# Patient Record
Sex: Female | Born: 1955 | Race: White | Hispanic: No | Marital: Married | Smoking: Former smoker
Health system: Southern US, Community
[De-identification: ages and names within clinical notes are randomized; demographics above are authoritative.]

## PROBLEM LIST (undated history)

## (undated) DIAGNOSIS — R011 Cardiac murmur, unspecified: Secondary | ICD-10-CM

## (undated) DIAGNOSIS — M1990 Unspecified osteoarthritis, unspecified site: Secondary | ICD-10-CM

## (undated) DIAGNOSIS — E785 Hyperlipidemia, unspecified: Secondary | ICD-10-CM

## (undated) DIAGNOSIS — K589 Irritable bowel syndrome without diarrhea: Secondary | ICD-10-CM

## (undated) DIAGNOSIS — I493 Ventricular premature depolarization: Secondary | ICD-10-CM

## (undated) DIAGNOSIS — K219 Gastro-esophageal reflux disease without esophagitis: Secondary | ICD-10-CM

## (undated) DIAGNOSIS — I10 Essential (primary) hypertension: Secondary | ICD-10-CM

## (undated) DIAGNOSIS — E039 Hypothyroidism, unspecified: Secondary | ICD-10-CM

## (undated) DIAGNOSIS — M8580 Other specified disorders of bone density and structure, unspecified site: Secondary | ICD-10-CM

## (undated) DIAGNOSIS — G44009 Cluster headache syndrome, unspecified, not intractable: Secondary | ICD-10-CM

## (undated) DIAGNOSIS — N83209 Unspecified ovarian cyst, unspecified side: Secondary | ICD-10-CM

## (undated) HISTORY — PX: CHOLECYSTECTOMY: SHX55

## (undated) HISTORY — DX: Unspecified osteoarthritis, unspecified site: M19.90

## (undated) HISTORY — PX: ANKLE SURGERY: SHX546

## (undated) HISTORY — DX: Cluster headache syndrome, unspecified, not intractable: G44.009

## (undated) HISTORY — DX: Irritable bowel syndrome without diarrhea: K58.9

## (undated) HISTORY — DX: Essential (primary) hypertension: I10

## (undated) HISTORY — DX: Other specified disorders of bone density and structure, unspecified site: M85.80

## (undated) HISTORY — DX: Ventricular premature depolarization: I49.3

## (undated) HISTORY — DX: Gastro-esophageal reflux disease without esophagitis: K21.9

## (undated) HISTORY — DX: Cardiac murmur, unspecified: R01.1

## (undated) HISTORY — PX: DILATION AND CURETTAGE OF UTERUS: SHX78

## (undated) HISTORY — PX: OTHER SURGICAL HISTORY: SHX169

## (undated) HISTORY — DX: Hypothyroidism, unspecified: E03.9

## (undated) HISTORY — DX: Hyperlipidemia, unspecified: E78.5

## (undated) HISTORY — DX: Unspecified ovarian cyst, unspecified side: N83.209

## (undated) HISTORY — PX: TOTAL SHOULDER REPLACEMENT: SUR1217

---

## 1998-04-24 ENCOUNTER — Other Ambulatory Visit: Admission: RE | Admit: 1998-04-24 | Discharge: 1998-04-24 | Payer: Self-pay | Admitting: *Deleted

## 1999-09-28 ENCOUNTER — Other Ambulatory Visit: Admission: RE | Admit: 1999-09-28 | Discharge: 1999-09-28 | Payer: Self-pay | Admitting: *Deleted

## 2000-04-30 ENCOUNTER — Ambulatory Visit (HOSPITAL_COMMUNITY): Admission: RE | Admit: 2000-04-30 | Discharge: 2000-04-30 | Payer: Self-pay | Admitting: Internal Medicine

## 2000-11-18 ENCOUNTER — Other Ambulatory Visit: Admission: RE | Admit: 2000-11-18 | Discharge: 2000-11-18 | Payer: Self-pay | Admitting: *Deleted

## 2001-12-24 ENCOUNTER — Other Ambulatory Visit: Admission: RE | Admit: 2001-12-24 | Discharge: 2001-12-24 | Payer: Self-pay | Admitting: *Deleted

## 2003-03-17 ENCOUNTER — Other Ambulatory Visit: Admission: RE | Admit: 2003-03-17 | Discharge: 2003-03-17 | Payer: Self-pay | Admitting: *Deleted

## 2004-03-22 ENCOUNTER — Other Ambulatory Visit: Admission: RE | Admit: 2004-03-22 | Discharge: 2004-03-22 | Payer: Self-pay | Admitting: Obstetrics and Gynecology

## 2004-06-12 ENCOUNTER — Encounter (INDEPENDENT_AMBULATORY_CARE_PROVIDER_SITE_OTHER): Payer: Self-pay | Admitting: Specialist

## 2004-06-12 ENCOUNTER — Ambulatory Visit (HOSPITAL_COMMUNITY): Admission: RE | Admit: 2004-06-12 | Discharge: 2004-06-12 | Payer: Self-pay | Admitting: Obstetrics and Gynecology

## 2005-05-20 ENCOUNTER — Encounter: Admission: RE | Admit: 2005-05-20 | Discharge: 2005-05-20 | Payer: Self-pay | Admitting: Obstetrics and Gynecology

## 2005-06-06 ENCOUNTER — Other Ambulatory Visit: Admission: RE | Admit: 2005-06-06 | Discharge: 2005-06-06 | Payer: Self-pay | Admitting: Obstetrics and Gynecology

## 2006-06-24 ENCOUNTER — Encounter: Admission: RE | Admit: 2006-06-24 | Discharge: 2006-06-24 | Payer: Self-pay | Admitting: Obstetrics and Gynecology

## 2006-06-27 ENCOUNTER — Encounter: Admission: RE | Admit: 2006-06-27 | Discharge: 2006-06-27 | Payer: Self-pay | Admitting: Obstetrics and Gynecology

## 2007-07-07 ENCOUNTER — Encounter: Admission: RE | Admit: 2007-07-07 | Discharge: 2007-07-07 | Payer: Self-pay | Admitting: Obstetrics and Gynecology

## 2008-07-13 ENCOUNTER — Encounter: Admission: RE | Admit: 2008-07-13 | Discharge: 2008-07-13 | Payer: Self-pay | Admitting: Obstetrics and Gynecology

## 2009-07-25 ENCOUNTER — Encounter: Admission: RE | Admit: 2009-07-25 | Discharge: 2009-07-25 | Payer: Self-pay | Admitting: Obstetrics and Gynecology

## 2010-03-11 ENCOUNTER — Encounter: Payer: Self-pay | Admitting: Obstetrics and Gynecology

## 2010-06-05 ENCOUNTER — Other Ambulatory Visit: Payer: Self-pay | Admitting: Obstetrics and Gynecology

## 2010-06-05 DIAGNOSIS — Z1231 Encounter for screening mammogram for malignant neoplasm of breast: Secondary | ICD-10-CM

## 2010-07-06 NOTE — Op Note (Signed)
NAME:  Maria Wade, Maria Wade           ACCOUNT NO.:  [id number]   MEDICAL RECORD NO.:  [id number]          PATIENT TYPE:  AMB   LOCATION:  SDC                           FACILITY:  [facility]   PHYSICIAN:  Michelle L. Grewal, M.D.DATE OF BIRTH:  07-26-1955   DATE OF PROCEDURE:  06/12/2004  DATE OF DISCHARGE:                                 OPERATIVE REPORT   PREOPERATIVE DIAGNOSES:  1.  Dysfunctional uterine bleeding.  2.  Endometrial polyp.   POSTOPERATIVE DIAGNOSES:  1.  Dysfunctional uterine bleeding.  2.  Endometrial polyp.   PROCEDURE:  1.  Dilatation and curettage.  2.  Diagnostic hysteroscopy.   ANESTHESIA:  MAC with local.   SPECIMENS:  Uterine curettings.   ESTIMATED BLOOD LOSS:  Minimal.   COMPLICATIONS:  None.   PROCEDURE:  The patient is taken to the operating room after informed  consent is obtained.  She is prepped and draped in usual sterile fashion and  an in-and-out catheter is used to empty the bladder.  A speculum is inserted  through the vagina.  The cervix is grasped with a tenaculum and the  paracervical block is performed in the standard fashion.  The cervical  internal os was gently dilated using Pratt dilators.  We then inserted the  diagnostic hysteroscope and the uterus was entirely inspected and  endometrium is noted to be clear except for small polypoid lesion notably  arising from the right side of the uterine wall.  The hysteroscope was  removed and a sharp curette was inserted.  The uterus was thoroughly  curetted of all tissue and all tissue is set aside.  Polyp forceps were also  used to remove the remainder of tissue.  We then reintroduced the diagnostic  hysteroscope into the uterine cavity and the uterine cavity was completely  cleaned at that point.  All instruments were removed from the vagina.  All  sponge, laparotomy pad, and instrument counts were correct x2.  The patient  went to the recovery room in stable condition.      MLG/MEDQ  D:   06/12/2004  T:  06/12/2004  Job:  [id number]

## 2010-07-30 ENCOUNTER — Ambulatory Visit
Admission: RE | Admit: 2010-07-30 | Discharge: 2010-07-30 | Disposition: A | Discharge status: Home / Self Care | Payer: 59 | Source: Ambulatory Visit | Attending: Obstetrics and Gynecology | Admitting: Obstetrics and Gynecology

## 2010-07-30 DIAGNOSIS — Z1231 Encounter for screening mammogram for malignant neoplasm of breast: Secondary | ICD-10-CM

## 2010-09-20 ENCOUNTER — Other Ambulatory Visit: Payer: Self-pay | Admitting: Cardiology

## 2010-09-20 ENCOUNTER — Ambulatory Visit
Admission: RE | Admit: 2010-09-20 | Discharge: 2010-09-20 | Disposition: A | Discharge status: Home / Self Care | Payer: 59 | Source: Ambulatory Visit | Attending: Cardiology | Admitting: Cardiology

## 2010-09-20 DIAGNOSIS — R002 Palpitations: Secondary | ICD-10-CM

## 2010-10-24 ENCOUNTER — Encounter: Payer: Self-pay | Admitting: Cardiology

## 2010-10-24 ENCOUNTER — Other Ambulatory Visit (HOSPITAL_COMMUNITY): Payer: Self-pay | Admitting: Cardiology

## 2010-11-02 ENCOUNTER — Other Ambulatory Visit (HOSPITAL_COMMUNITY): Payer: 59

## 2010-12-24 ENCOUNTER — Encounter: Payer: Self-pay | Admitting: Internal Medicine

## 2011-01-29 ENCOUNTER — Encounter: Payer: Self-pay | Admitting: *Deleted

## 2011-02-01 ENCOUNTER — Ambulatory Visit (INDEPENDENT_AMBULATORY_CARE_PROVIDER_SITE_OTHER): Payer: 59 | Admitting: Internal Medicine

## 2011-02-01 ENCOUNTER — Encounter: Payer: Self-pay | Admitting: Internal Medicine

## 2011-02-01 VITALS — BP 118/84 | HR 74 | Ht 64.0 in | Wt 202.3 lb

## 2011-02-01 DIAGNOSIS — K219 Gastro-esophageal reflux disease without esophagitis: Secondary | ICD-10-CM

## 2011-02-01 DIAGNOSIS — R1319 Other dysphagia: Secondary | ICD-10-CM

## 2011-02-01 NOTE — Progress Notes (Signed)
Maria Wade 10-15-1955 MRN [id number]   History of Present Illness:  This is a 55 year old white female with chronic gastroesophageal reflux first documented in 2004 when we saw her for irritable bowel syndrome with predominant diarrhea. A colonoscopy in 2004 was normal. She is due for a recall in May 2014. She is now complaining of nocturnal cough, hoarseness, and a bitter taste in her mouth within 30-40 minutes postprandially. She has had a remote cholecystectomy. She has occasional sensation of globus in her throat. She does not smoke or drink alcohol. Her weight has been stable at 200 pounds. She denies dysphagia or odynophagia. Her current medications are Prilosec 40 mg twice a day. Her brother has Barrett's esophagus.   Past Medical History  Diagnosis Date  . Hyperlipidemia   . Irritable bowel syndrome   . Hypothyroidism   . Ovarian cyst   . Osteopenia   . Cluster headaches    Past Surgical History  Procedure Date  . Cholecystectomy   . Dilation and curettage of uterus   . Ankle surgery     left  . Stromal puncture     both eyes    reports that she quit smoking about 20 years ago. Her smoking use included Cigarettes. She has never used smokeless tobacco. She reports that she does not drink alcohol or use illicit drugs. family history includes Barrett's esophagus in her brother; COPD in her mother; Colon polyps in her father; Crohn's disease in her sister; Heart disease in her mother; Prostate cancer in her father; and Stroke in an unspecified family member. No Known Allergies      Review of Systems: Negative for abdominal pain diarrhea shortness of breath or chest pain  The remainder of the 10 point ROS is negative except as outlined in H&P   Physical Exam: General appearance  Well developed, in no distress. Eyes- non icteric. HEENT nontraumatic, normocephalic. Mouth no lesions, tongue papillated, no cheilosis. Neck supple without adenopathy, thyroid not  enlarged, no carotid bruits, no JVD. Lungs Clear to auscultation bilaterally. Cor normal S1, normal S2, regular rhythm, no murmur,  quiet precordium. Abdomen: Soft with minimal tenderness in epigastrium. Normoactive bowel sounds. Rectal: Not done Extremities no pedal edema. Skin no lesions. Neurological alert and oriented x 3. Psychological normal mood and affect.  Assessment and Plan:  Problem #1 Chronic gastroesophageal reflux with a recent exacerbation raising the question of LPR. This occurs postprandially as well as at night. Her bitter taste in the mouth may represent bile reflux. She has a family history of Barrett's esophagus and she is concerned about it. We will proceed with an upper endoscopy and biopsies. We will rule out Barrett's esophagus or hiatal hernia. She was in the past on Nexium 40 mg twice a day and we may consider switching to that eventually. She will also supplement her regimen with Gaviscon. She may also use Carafate slurry if we find bile reflux. I have asked her to elevate the head of the bed at night.  Problem #2 Colorectal screening. Patient is due for a colonoscopy in May 2014.   02/01/2011 Maria Wade

## 2011-02-01 NOTE — Patient Instructions (Signed)
You have been scheduled for an endoscopy with propofol. Please follow written instructions given to you at your visit today. CC: Dr Chilton Greathouse

## 2011-02-06 ENCOUNTER — Encounter: Payer: 59 | Admitting: Internal Medicine

## 2011-02-06 ENCOUNTER — Ambulatory Visit (AMBULATORY_SURGERY_CENTER): Payer: 59 | Admitting: Internal Medicine

## 2011-02-06 ENCOUNTER — Encounter: Payer: Self-pay | Admitting: Internal Medicine

## 2011-02-06 DIAGNOSIS — R1319 Other dysphagia: Secondary | ICD-10-CM

## 2011-02-06 DIAGNOSIS — K2289 Other specified disease of esophagus: Secondary | ICD-10-CM

## 2011-02-06 DIAGNOSIS — K209 Esophagitis, unspecified without bleeding: Secondary | ICD-10-CM

## 2011-02-06 DIAGNOSIS — K219 Gastro-esophageal reflux disease without esophagitis: Secondary | ICD-10-CM

## 2011-02-06 DIAGNOSIS — K228 Other specified diseases of esophagus: Secondary | ICD-10-CM

## 2011-02-06 MED ORDER — ESOMEPRAZOLE MAGNESIUM 40 MG PO CPDR: 40.0000 mg | DELAYED_RELEASE_CAPSULE | Freq: Every day | ORAL | Status: DC

## 2011-02-06 MED ORDER — SODIUM CHLORIDE 0.9 % IV SOLN: 500.0000 mL | INTRAVENOUS | Status: DC

## 2011-02-06 NOTE — Op Note (Signed)
[facility] [address] [place], [place]  [address]  ENDOSCOPY PROCEDURE REPORT  PATIENT:  Maria, Wade  MR#:  [id number] BIRTHDATE:  1955/06/23, 55 yrs. old  GENDER:  female  ENDOSCOPIST:  Hedwig Morton. Juanda Chance, MD Referred by:  Chilton Greathouse, M.D.  PROCEDURE DATE:  02/06/2011 PROCEDURE:  EGD with biopsy, 43239 ASA CLASS:  Class I INDICATIONS:  GERD, heartburn brother with Barrett's esophagus refractory to Prilosec  MEDICATIONS:   MAC sedation, administered by CRNA, propofol (Diprivan) TOPICAL ANESTHETIC:  none  DESCRIPTION OF PROCEDURE:   After the risks benefits and alternatives of the procedure were thoroughly explained, informed consent was obtained.  The LB [id number]-H180 [id number] endoscope was introduced through the mouth and advanced to the second portion of the duodenum, without limitations.  The instrument was slowly withdrawn as the mucosa was fully examined. <<PROCEDUREIMAGES>>  The upper, middle, and distal third of the esophagus were carefully inspected and no abnormalities were noted. The z-line was well seen at the GEJ. The endoscope was pushed into the fundus which was normal including a retroflexed view. The antrum,gastric body, first and second part of the duodenum were unremarkable. few fundic gland polyps With standard forceps, a biopsy was obtained and sent to pathology (see image1, image3, image4, image5, image6, image7, and image8). Bx at g-e junction  Bile reflux was found (see image2).    Retroflexed views revealed no abnormalities. The scope was then withdrawn from the patient and the procedure completed.  COMPLICATIONS:  None  ENDOSCOPIC IMPRESSION: 1) Normal EGD 2) Bile reflux RECOMMENDATIONS: 1) Await biopsy results Nexiem 40 mg po qd, if not saficcient,try to add Prilosec 40 mg   REPEAT EXAM:  In 0 year(s) for.  ______________________________ Hedwig Morton. Juanda Chance, MD  CC:  n. eSIGNED:   Hedwig Morton. Damonie Furney at 02/06/2011 09:51  AM  Arlana Lindau, [phone]

## 2011-02-06 NOTE — Patient Instructions (Signed)
Please refer to your blue and neon green sheets for instructions regarding diet and activity for the rest of today.  You may resume your medications as you would normally take them.   Nexium prescription given.

## 2011-02-06 NOTE — Progress Notes (Signed)
Patient did not experience any of the following events: a burn prior to discharge; a fall within the facility; wrong site/side/patient/procedure/implant event; or a hospital transfer or hospital admission upon discharge from the facility. (G8907) Patient did not have preoperative order for IV antibiotic SSI prophylaxis. (G8918)  

## 2011-02-07 ENCOUNTER — Telehealth: Payer: Self-pay | Admitting: *Deleted

## 2011-02-07 NOTE — Telephone Encounter (Signed)
No answer, left message ID on machine

## 2011-02-12 ENCOUNTER — Encounter: Payer: Self-pay | Admitting: Internal Medicine

## 2011-03-06 ENCOUNTER — Encounter: Payer: 59 | Admitting: Internal Medicine

## 2011-06-25 ENCOUNTER — Other Ambulatory Visit: Payer: Self-pay | Admitting: Obstetrics and Gynecology

## 2011-06-25 DIAGNOSIS — Z1231 Encounter for screening mammogram for malignant neoplasm of breast: Secondary | ICD-10-CM

## 2011-07-25 ENCOUNTER — Other Ambulatory Visit: Payer: Self-pay | Admitting: Obstetrics and Gynecology

## 2011-08-05 ENCOUNTER — Ambulatory Visit
Admission: RE | Admit: 2011-08-05 | Discharge: 2011-08-05 | Disposition: A | Discharge status: Home / Self Care | Payer: 59 | Source: Ambulatory Visit | Attending: Obstetrics and Gynecology | Admitting: Obstetrics and Gynecology

## 2011-08-05 DIAGNOSIS — Z1231 Encounter for screening mammogram for malignant neoplasm of breast: Secondary | ICD-10-CM

## 2011-08-08 ENCOUNTER — Other Ambulatory Visit: Payer: Self-pay | Admitting: Obstetrics and Gynecology

## 2011-08-08 DIAGNOSIS — R928 Other abnormal and inconclusive findings on diagnostic imaging of breast: Secondary | ICD-10-CM

## 2011-08-12 ENCOUNTER — Other Ambulatory Visit: Payer: Self-pay | Admitting: Obstetrics and Gynecology

## 2011-08-12 ENCOUNTER — Ambulatory Visit
Admission: RE | Admit: 2011-08-12 | Discharge: 2011-08-12 | Disposition: A | Discharge status: Home / Self Care | Payer: 59 | Source: Ambulatory Visit | Attending: Obstetrics and Gynecology | Admitting: Obstetrics and Gynecology

## 2011-08-12 DIAGNOSIS — R928 Other abnormal and inconclusive findings on diagnostic imaging of breast: Secondary | ICD-10-CM

## 2011-08-13 ENCOUNTER — Encounter: Payer: Self-pay | Admitting: *Deleted

## 2011-08-16 ENCOUNTER — Other Ambulatory Visit: Payer: Self-pay | Admitting: Internal Medicine

## 2011-08-23 ENCOUNTER — Ambulatory Visit (INDEPENDENT_AMBULATORY_CARE_PROVIDER_SITE_OTHER): Payer: 59 | Admitting: Internal Medicine

## 2011-08-23 ENCOUNTER — Encounter: Payer: Self-pay | Admitting: Internal Medicine

## 2011-08-23 VITALS — BP 130/80 | HR 80 | Ht 64.0 in | Wt 210.0 lb

## 2011-08-23 DIAGNOSIS — R195 Other fecal abnormalities: Secondary | ICD-10-CM

## 2011-08-23 MED ORDER — MOVIPREP 100 G PO SOLR: ORAL | Status: DC

## 2011-08-23 NOTE — Patient Instructions (Addendum)
You have been scheduled for a colonoscopy. Please follow written instructions given to you at your visit today.  Please pick up your prep kit at the pharmacy within the next 1-3 days. CC: Dr Felipa Eth

## 2011-08-23 NOTE — Progress Notes (Signed)
Maria Wade 07/10/1955 MRN [id number]   History of Present Illness:  This is a 55-year-old, white female with Hemoccult-positive stool on a home test (Hemosure). She repeated similar home tests 3 times since then and they have been negative. She denies any visible blood per rectum. We have seen her in the past for chronic gastroesophageal reflux and for a colorectal screening. Last upper endoscopy in December 2012 showed the bile reflux. Her last colonoscopy in May 2004 was normal. She has a positive family history of colon polyps in her father and of Barrett's esophagus in her brother and Crohn's disease in her brother and sister. She denies any diarrhea. There is a history of a laparoscopic cholecystectomy. Her last hemoglobin in May 2013 was 13.5, and her hematocrit was 39.3. She is postmenopausal.   Past Medical History  Diagnosis Date  . Hyperlipidemia   . Irritable bowel syndrome   . Hypothyroidism   . Ovarian cyst   . Osteopenia   . Cluster headaches   . PVC (premature ventricular contraction)     occasional PVCs  . GERD (gastroesophageal reflux disease)    Past Surgical History  Procedure Date  . Cholecystectomy   . Dilation and curettage of uterus   . Ankle surgery     left  . Stromal puncture     both eyes    reports that she quit smoking about 21 years ago. Her smoking use included Cigarettes. She has never used smokeless tobacco. She reports that she does not drink alcohol or use illicit drugs. family history includes Barrett's esophagus in her brother; COPD in her mother; Colon polyps in her father; Crohn's disease in her sister; Heart disease in her mother; Prostate cancer in her father; and Stroke in an unspecified family member.  There is no history of Colon cancer, and Esophageal cancer, and Rectal cancer, and Stomach cancer, . No Known Allergies      Review of Systems: Gastroesophageal reflux controlled on Nexiem negative for dysphagia. Negative for  visible blood per rectum  The remainder of the 10 point ROS is negative except as outlined in H&P   Physical Exam: General appearance  Well developed, in no distress. Eyes- non icteric. HEENT nontraumatic, normocephalic. Mouth no lesions, tongue papillated, no cheilosis. Neck supple without adenopathy, thyroid not enlarged, no carotid bruits, no JVD. Lungs Clear to auscultation bilaterally. Cor normal S1, normal S2, regular rhythm, no murmur,  quiet precordium. Abdomen: Soft nontender abdomen with normal active bowel sounds. Post cholecystectomy scar. Liver edge at costal margin. Rectal: Not done. Extremities no pedal edema. Skin no lesions. Neurological alert and oriented x 3. Psychological normal mood and affect.  Assessment and Plan:  Problem #1 55-year-old white female with heme positive stool on a home test. Subsequent tests have been negative. She is due for a recall colonoscopy. Her last one was in May 2004. We will go ahead and schedule an outpatient colonoscopy with moviprep. Her gastroesophageal reflux is controlled on Nexium. I have discussed the procedure, prep and the sedation with the patient.  08/23/2011 Maria Wade  

## 2011-08-24 ENCOUNTER — Encounter: Payer: Self-pay | Admitting: Internal Medicine

## 2011-09-09 ENCOUNTER — Encounter (HOSPITAL_COMMUNITY): Payer: Self-pay | Admitting: *Deleted

## 2011-09-09 ENCOUNTER — Encounter (HOSPITAL_COMMUNITY)
Admission: RE | Disposition: A | Discharge status: Home / Self Care | Payer: Self-pay | Source: Ambulatory Visit | Attending: Internal Medicine

## 2011-09-09 ENCOUNTER — Ambulatory Visit (HOSPITAL_COMMUNITY)
Admission: RE | Admit: 2011-09-09 | Discharge: 2011-09-09 | Disposition: A | Discharge status: Home / Self Care | Payer: 59 | Source: Ambulatory Visit | Attending: Internal Medicine | Admitting: Internal Medicine

## 2011-09-09 DIAGNOSIS — K219 Gastro-esophageal reflux disease without esophagitis: Secondary | ICD-10-CM | POA: Insufficient documentation

## 2011-09-09 DIAGNOSIS — Z83719 Family history of colon polyps, unspecified: Secondary | ICD-10-CM | POA: Insufficient documentation

## 2011-09-09 DIAGNOSIS — Z8379 Family history of other diseases of the digestive system: Secondary | ICD-10-CM | POA: Insufficient documentation

## 2011-09-09 DIAGNOSIS — E039 Hypothyroidism, unspecified: Secondary | ICD-10-CM | POA: Insufficient documentation

## 2011-09-09 DIAGNOSIS — Z8371 Family history of colonic polyps: Secondary | ICD-10-CM | POA: Insufficient documentation

## 2011-09-09 DIAGNOSIS — R195 Other fecal abnormalities: Secondary | ICD-10-CM

## 2011-09-09 DIAGNOSIS — E785 Hyperlipidemia, unspecified: Secondary | ICD-10-CM | POA: Insufficient documentation

## 2011-09-09 DIAGNOSIS — D126 Benign neoplasm of colon, unspecified: Secondary | ICD-10-CM

## 2011-09-09 HISTORY — PX: COLONOSCOPY: SHX5424

## 2011-09-09 SURGERY — COLONOSCOPY
Anesthesia: Moderate Sedation

## 2011-09-09 MED ORDER — MIDAZOLAM HCL 5 MG/5ML IJ SOLN
INTRAMUSCULAR | Status: DC | PRN
  Administered 2011-09-09: 1 mg via INTRAVENOUS
  Administered 2011-09-09 (×2): 2 mg via INTRAVENOUS
  Administered 2011-09-09 (×2): 2.5 mg via INTRAVENOUS

## 2011-09-09 MED ORDER — SODIUM CHLORIDE 0.9 % IV SOLN
INTRAVENOUS | Status: DC
  Administered 2011-09-09: 500 mL via INTRAVENOUS

## 2011-09-09 MED ORDER — FENTANYL CITRATE 0.05 MG/ML IJ SOLN
INTRAMUSCULAR | Status: AC
  Filled 2011-09-09: qty 4

## 2011-09-09 MED ORDER — DIPHENHYDRAMINE HCL 50 MG/ML IJ SOLN
INTRAMUSCULAR | Status: AC
  Filled 2011-09-09: qty 1

## 2011-09-09 MED ORDER — MIDAZOLAM HCL 10 MG/2ML IJ SOLN
INTRAMUSCULAR | Status: AC
  Filled 2011-09-09: qty 4

## 2011-09-09 MED ORDER — FENTANYL CITRATE 0.05 MG/ML IJ SOLN
INTRAMUSCULAR | Status: DC | PRN
  Administered 2011-09-09 (×4): 25 ug via INTRAVENOUS

## 2011-09-09 MED ORDER — DIPHENHYDRAMINE HCL 50 MG/ML IJ SOLN
INTRAMUSCULAR | Status: DC | PRN
  Administered 2011-09-09: 25 mg via INTRAVENOUS

## 2011-09-09 NOTE — Interval H&P Note (Signed)
History and Physical Interval Note:  09/09/2011 9:09 AM  Maria Wade  has presented today for surgery, with the diagnosis of heme positive stool  The various methods of treatment have been discussed with the patient and family. After consideration of risks, benefits and other options for treatment, the patient has consented to  Procedure(s) (LRB): COLONOSCOPY (N/A) as a surgical intervention .  The patient's history has been reviewed, patient examined, no change in status, stable for surgery.  I have reviewed the patient's chart and labs.  Questions were answered to the patient's satisfaction.     [name]

## 2011-09-09 NOTE — H&P (View-Only) (Signed)
Maria Wade Aug 12, 1955 MRN [id number]   History of Present Illness:  This is a 56 year old, white female with Hemoccult-positive stool on a home test (Hemosure). She repeated similar home tests 3 times since then and they have been negative. She denies any visible blood per rectum. We have seen her in the past for chronic gastroesophageal reflux and for a colorectal screening. Last upper endoscopy in December 2012 showed the bile reflux. Her last colonoscopy in May 2004 was normal. She has a positive family history of colon polyps in her father and of Barrett's esophagus in her brother and Crohn's disease in her brother and sister. She denies any diarrhea. There is a history of a laparoscopic cholecystectomy. Her last hemoglobin in May 2013 was 13.5, and her hematocrit was 39.3. She is postmenopausal.   Past Medical History  Diagnosis Date  . Hyperlipidemia   . Irritable bowel syndrome   . Hypothyroidism   . Ovarian cyst   . Osteopenia   . Cluster headaches   . PVC (premature ventricular contraction)     occasional PVCs  . GERD (gastroesophageal reflux disease)    Past Surgical History  Procedure Date  . Cholecystectomy   . Dilation and curettage of uterus   . Ankle surgery     left  . Stromal puncture     both eyes    reports that she quit smoking about 21 years ago. Her smoking use included Cigarettes. She has never used smokeless tobacco. She reports that she does not drink alcohol or use illicit drugs. family history includes Barrett's esophagus in her brother; COPD in her mother; Colon polyps in her father; Crohn's disease in her sister; Heart disease in her mother; Prostate cancer in her father; and Stroke in an unspecified family member.  There is no history of Colon cancer, and Esophageal cancer, and Rectal cancer, and Stomach cancer, . No Known Allergies      Review of Systems: Gastroesophageal reflux controlled on Nexiem negative for dysphagia. Negative for  visible blood per rectum  The remainder of the 10 point ROS is negative except as outlined in H&P   Physical Exam: General appearance  Well developed, in no distress. Eyes- non icteric. HEENT nontraumatic, normocephalic. Mouth no lesions, tongue papillated, no cheilosis. Neck supple without adenopathy, thyroid not enlarged, no carotid bruits, no JVD. Lungs Clear to auscultation bilaterally. Cor normal S1, normal S2, regular rhythm, no murmur,  quiet precordium. Abdomen: Soft nontender abdomen with normal active bowel sounds. Post cholecystectomy scar. Liver edge at costal margin. Rectal: Not done. Extremities no pedal edema. Skin no lesions. Neurological alert and oriented x 3. Psychological normal mood and affect.  Assessment and Plan:  Problem #39 56 year old white female with heme positive stool on a home test. Subsequent tests have been negative. She is due for a recall colonoscopy. Her last one was in May 2004. We will go ahead and schedule an outpatient colonoscopy with moviprep. Her gastroesophageal reflux is controlled on Nexium. I have discussed the procedure, prep and the sedation with the patient.  08/23/2011 [name]

## 2011-09-09 NOTE — Op Note (Signed)
[facility] [address] [place], [place]  [address]  COLONOSCOPY PROCEDURE REPORT  PATIENT:  Maria Wade, Maria Wade  MR#:  [id number] BIRTHDATE:  1956/01/10, 56 yrs. old  GENDER:  female ENDOSCOPIST:  Hedwig Morton. Juanda Chance, MD REF. BY:  Chilton Greathouse, M.D. PROCEDURE DATE:  09/09/2011 PROCEDURE:  Colonoscopy with snare polypectomy ASA CLASS:  Class II INDICATIONS:  heme positive stool MEDICATIONS:   These medications were titrated to patient response per physician's verbal order, Versed 10 mg, Fentanyl 100 mcg, Benadryl 25 mg  DESCRIPTION OF PROCEDURE:   After the risks and benefits and of the procedure were explained, informed consent was obtained. Digital rectal exam was performed and revealed no rectal masses. The Pentax Peds Colonoscope [id number] endoscope was introduced through the anus and advanced to the cecum, which was identified by both the appendix and ileocecal valve.  The quality of the prep was excellent, using MoviPrep.  The instrument was then slowly withdrawn as the colon was fully examined. <<PROCEDUREIMAGES>>  FINDINGS:  A pedunculated polyp was found. 10 mm polyp at 50 cm, bleeding spots on the surface Polyp was snared without cautery. Retrieval was successful (see image4, image5, image6, and image7). snare polyp  This was otherwise a normal examination of the colon (see image8, image1, image2, and image3).   Retroflexed views in the rectum revealed no abnormalities.    The scope was then withdrawn from the patient and the procedure completed.  COMPLICATIONS:  None ENDOSCOPIC IMPRESSION: 1) Pedunculated polyp 2) Otherwise normal examination heme positive stool likely from bleeding polyp RECOMMENDATIONS: 1) Await pathology results 2) High fiber diet.  REPEAT EXAM:  In 5 year(s) for.  ______________________________ Hedwig Morton. Juanda Chance, MD  CC:  n. eSIGNED:   Hedwig Morton. Brodie at 09/09/2011 10:30 AM  Arlana Lindau, [id number]

## 2011-09-10 ENCOUNTER — Encounter: Payer: Self-pay | Admitting: Internal Medicine

## 2011-09-11 ENCOUNTER — Encounter (HOSPITAL_COMMUNITY): Payer: Self-pay | Admitting: Internal Medicine

## 2011-12-16 ENCOUNTER — Other Ambulatory Visit: Payer: Self-pay | Admitting: Gastroenterology

## 2012-02-20 ENCOUNTER — Other Ambulatory Visit: Payer: Self-pay | Admitting: *Deleted

## 2012-02-20 MED ORDER — ESOMEPRAZOLE MAGNESIUM 40 MG PO CPDR: 40.0000 mg | DELAYED_RELEASE_CAPSULE | Freq: Every day | ORAL | Status: DC

## 2012-06-30 ENCOUNTER — Other Ambulatory Visit: Payer: Self-pay

## 2012-06-30 DIAGNOSIS — Z1231 Encounter for screening mammogram for malignant neoplasm of breast: Secondary | ICD-10-CM

## 2012-07-28 ENCOUNTER — Other Ambulatory Visit: Payer: Self-pay | Admitting: Obstetrics and Gynecology

## 2012-08-17 ENCOUNTER — Ambulatory Visit
Admission: RE | Admit: 2012-08-17 | Discharge: 2012-08-17 | Disposition: A | Discharge status: Home / Self Care | Payer: 59 | Source: Ambulatory Visit

## 2012-08-17 DIAGNOSIS — Z1231 Encounter for screening mammogram for malignant neoplasm of breast: Secondary | ICD-10-CM

## 2012-10-21 ENCOUNTER — Other Ambulatory Visit: Payer: Self-pay | Admitting: Neurology

## 2012-10-21 DIAGNOSIS — R9082 White matter disease, unspecified: Secondary | ICD-10-CM

## 2012-11-09 ENCOUNTER — Ambulatory Visit
Admission: RE | Admit: 2012-11-09 | Discharge: 2012-11-09 | Disposition: A | Discharge status: Home / Self Care | Payer: 59 | Source: Ambulatory Visit | Attending: Neurology | Admitting: Neurology

## 2012-11-09 DIAGNOSIS — R9082 White matter disease, unspecified: Secondary | ICD-10-CM

## 2012-11-09 MED ORDER — GADOBENATE DIMEGLUMINE 529 MG/ML IV SOLN
19.0000 mL | Freq: Once | INTRAVENOUS | Status: AC | PRN
  Administered 2012-11-09: 19 mL via INTRAVENOUS

## 2013-07-14 ENCOUNTER — Other Ambulatory Visit: Payer: Self-pay

## 2013-07-14 DIAGNOSIS — Z1231 Encounter for screening mammogram for malignant neoplasm of breast: Secondary | ICD-10-CM

## 2013-08-05 ENCOUNTER — Other Ambulatory Visit: Payer: Self-pay | Admitting: Obstetrics and Gynecology

## 2013-08-06 LAB — CYTOLOGY - PAP

## 2013-09-13 ENCOUNTER — Ambulatory Visit
Admission: RE | Admit: 2013-09-13 | Discharge: 2013-09-13 | Disposition: A | Discharge status: Home / Self Care | Payer: 59 | Source: Ambulatory Visit

## 2013-09-13 DIAGNOSIS — Z1231 Encounter for screening mammogram for malignant neoplasm of breast: Secondary | ICD-10-CM

## 2014-02-24 ENCOUNTER — Other Ambulatory Visit: Payer: Self-pay | Admitting: Neurology

## 2014-02-24 DIAGNOSIS — R9082 White matter disease, unspecified: Secondary | ICD-10-CM

## 2014-03-14 ENCOUNTER — Ambulatory Visit
Admission: RE | Admit: 2014-03-14 | Discharge: 2014-03-14 | Disposition: A | Discharge status: Home / Self Care | Payer: 59 | Source: Ambulatory Visit | Attending: Neurology | Admitting: Neurology

## 2014-03-14 DIAGNOSIS — R9082 White matter disease, unspecified: Secondary | ICD-10-CM

## 2014-07-27 ENCOUNTER — Other Ambulatory Visit: Payer: Self-pay

## 2014-07-27 DIAGNOSIS — Z1231 Encounter for screening mammogram for malignant neoplasm of breast: Secondary | ICD-10-CM

## 2014-09-19 ENCOUNTER — Ambulatory Visit
Admission: RE | Admit: 2014-09-19 | Discharge: 2014-09-19 | Disposition: A | Discharge status: Home / Self Care | Payer: 59 | Source: Ambulatory Visit

## 2014-09-19 DIAGNOSIS — Z1231 Encounter for screening mammogram for malignant neoplasm of breast: Secondary | ICD-10-CM

## 2015-10-11 ENCOUNTER — Encounter: Payer: Self-pay | Admitting: Pulmonary Disease

## 2015-11-28 ENCOUNTER — Ambulatory Visit (INDEPENDENT_AMBULATORY_CARE_PROVIDER_SITE_OTHER): Payer: 59 | Admitting: Gastroenterology

## 2015-11-28 ENCOUNTER — Encounter: Payer: Self-pay | Admitting: Gastroenterology

## 2015-11-28 DIAGNOSIS — R197 Diarrhea, unspecified: Secondary | ICD-10-CM

## 2015-11-28 DIAGNOSIS — A09 Infectious gastroenteritis and colitis, unspecified: Secondary | ICD-10-CM | POA: Diagnosis not present

## 2015-11-28 NOTE — Progress Notes (Signed)
[facility] Consult Note:  History: Maria Wade 11/28/2015  Referring physician: Tivis Ringer, MD  Reason for consult/chief complaint: Diarrhea (Patient has had it for 3 months, but now is better)   Subjective  HPI:  This is a 60 year old woman referred by primary care for chronic diarrhea. She believes it started several months ago when she began taking an herbal supplement for colon health that many people at her church were also taken. She believes it contained milk of magnesia and probiotics. She then developed chronic watery diarrhea up to 6 bowel movements per day with some nocturnal diarrhea as well. She slowly weaned off the medicine and the last dose about a month ago. Since then things are slowly improved proving and returning to normal. She still has postprandial bowel movements on the stool is still sometimes semi-formed or loose. Abdomen rectal bleeding, there is no abdominal pain, her appetite is good and her weight stable. She also recalls taking azithromycin for bronchitis sometime in April. Alexander's last colonoscopy with Dr. Olevia Perches in July 2015 revealed a tubular adenoma. She saw primary care for a physical in late August but apparently did not mention this, and thus no workup has been done. ROS:  Review of Systems  Constitutional: Negative for appetite change and unexpected weight change.  HENT: Negative for mouth sores and voice change.   Eyes: Negative for pain and redness.  Respiratory: Negative for cough and shortness of breath.   Cardiovascular: Negative for chest pain and palpitations.  Genitourinary: Negative for dysuria and hematuria.  Musculoskeletal: Negative for arthralgias and myalgias.  Skin: Negative for pallor and rash.  Neurological: Negative for weakness and headaches.  Hematological: Negative for adenopathy.     Past Medical History: Past Medical History:  Diagnosis Date  . Cluster headaches   . GERD  (gastroesophageal reflux disease)   . Hyperlipidemia   . Hypothyroidism   . Irritable bowel syndrome   . Osteopenia   . Ovarian cyst   . PVC (premature ventricular contraction)    occasional PVCs     Past Surgical History: Past Surgical History:  Procedure Laterality Date  . ANKLE SURGERY     left  . CHOLECYSTECTOMY    . COLONOSCOPY  09/09/2011   Procedure: COLONOSCOPY;  Surgeon: Lafayette Dragon, MD;  Location: [facility] END[facility]COPY;  Service: Endoscopy;  Laterality: N/A;  . DILATION AND CURETTAGE OF UTERUS    . stromal puncture     both eyes     Family History: Family History  Problem Relation Age of Onset  . Colon polyps Father   . Prostate cancer Father   . Crohn's disease Sister     Brother  . Barrett's esophagus Brother   . Stroke      grandparents  . COPD Mother   . Heart disease Mother   . Colon cancer Neg Hx   . Esophageal cancer Neg Hx   . Rectal cancer Neg Hx   . Stomach cancer Neg Hx     Social History: Social History   Social History  . Marital status: Married    Spouse name: N/A  . Number of children: 2  . Years of education: N/A   Occupational History  . retired Education officer, [employer]   Social History Main Topics  . Smoking status: Former Smoker    Types: Cigarettes    Quit date: 02/18/1990  . Smokeless tobacco: Never Used  . Alcohol use No  . Drug use: No  . Sexual  activity: Not Asked   Other Topics Concern  . None   Social History Narrative  . None    Allergies: No Known Allergies  Outpatient Meds: Current Outpatient Prescriptions  Medication Sig Dispense Refill  . calcium carbonate (OS-CAL) 600 MG TABS Take 600 mg by mouth 2 (two) times daily with a meal.    . calcium gluconate 650 MG tablet Take 650 mg by mouth daily.      . cholecalciferol (VITAMIN D) 1000 UNITS tablet Take 1,000 Units by mouth daily.      . cyanocobalamin 500 MCG tablet Take 500 mcg by mouth daily.      Marland Kitchen esomeprazole (NEXIUM) 40 MG capsule Take 1 capsule  (40 mg total) by mouth daily before breakfast. 90 capsule 1  . rosuvastatin (CRESTOR) 5 MG tablet Take 5 mg by mouth daily.     No current facility-administered medications for this visit.       ___________________________________________________________________ Objective   Exam:  BP 122/80   Pulse 78   Ht 5\' 4"  (1.626 m)   Wt 226 lb 12.8 oz (102.9 kg)   SpO2 98%   BMI 38.93 kg/m    General: this is a(n) well-appearing woman with good muscle mass   Eyes: sclera anicteric, no redness  ENT: oral mucosa moist without lesions, no cervical or supraclavicular lymphadenopathy, good dentition  CV: RRR without murmur, S1/S2, no JVD, no peripheral edema  Resp: clear to auscultation bilaterally, normal RR and effort noted  GI: soft, overweight, no tenderness, with active bowel sounds. No guarding or palpable organomegaly noted.  Skin; warm and dry, no rash or jaundice noted  Neuro: awake, alert and oriented x 3. Normal gross motor function and fluent speech  Labs:  Recurrent notes indicate normal CBC, CMP and TSH in August of this year.  Assessment: Encounter Diagnosis  Name Primary?  . Diarrhea of presumed infectious origin     The exact components of this herbal supplement" are currently unknown, but it seems unlikely should still be causing diarrhea. However, it sounds like things are also slowly but steadily improving on their own. I have ordered stool testing to rule out C. Difficile and ova and parasites. If negative, and she is not back to normal when she calls [place] in 3 or 4 weeks, we will schedule a colonoscopy to rule out microscopic colitis or less likely Crohn's disease ( despite family history).    Thank you for the courtesy of this consult.  Please call me with any questions or concerns.  Nelida Meuse III  CC: Tivis Ringer, MD

## 2015-11-28 NOTE — Patient Instructions (Addendum)
Stool studies as ordered.  Please call us in 3-4 weeks with an update.If not improved, we will schedule a colonoscopy.  If you are age 61 or older, your body mass index should be between 23-30. Your Body mass index is 38.93 kg/m. If this is out of the aforementioned range listed, please consider follow up with your Primary Care Provider.  If you are age 71 or younger, your body mass index should be between 19-25. Your Body mass index is 38.93 kg/m. If this is out of the aformentioned range listed, please consider follow up with your Primary Care Provider.   Thank you for choosing [facility]  Dr Wilfrid Lund III

## 2015-11-28 NOTE — Progress Notes (Deleted)
[facility] Consult Note:  History: Maria Wade 11/28/2015  Referring physician: Tivis Ringer, MD  Reason for consult/chief complaint: Diarrhea (Patient has had it for 3 months, but now is better)   Subjective  HPI:  Maria Wade was referred by primary care for several months of diarrhea with urgency. She believes it started just after she began a new "herbal supplement" for colon health which many people at her church were taken. She believes it contained milk of magnesia and probiotics. She then began having multiple loose to watery nonbloody bowel movements per day. She gradually started cutting it back and finally stopped it altogether about a month ago. She saw her PCP for routine physical in late August, but apparently did not mention it to them. Thus no testing is been done. The symptoms are steadily but slowly improving in the last month. Her stool is sometimes formed, other times semi- formed or loose. And she usually has the need for a BM about 10 minutes after eating. There is no nocturnal diarrhea, she had a few episodes of fecal soilage over the summer.  She is also finally recalled she took azithromycin for bronchitis in April of this year. [name]'s last colonoscopy in July 2013 with Dr. Olevia Perches revealed a tubular adenoma.  ROS:  Review of Systems  Constitutional: Negative for appetite change and unexpected weight change.  HENT: Negative for mouth sores and voice change.   Eyes: Negative for pain and redness.  Respiratory: Negative for cough and shortness of breath.   Cardiovascular: Negative for chest pain and palpitations.  Gastrointestinal: Negative for abdominal pain.  Genitourinary: Negative for dysuria and hematuria.  Musculoskeletal: Negative for arthralgias and myalgias.  Skin: Negative for pallor and rash.  Neurological: Negative for weakness and headaches.  Hematological: Negative for adenopathy.     Past Medical History: Past Medical  History:  Diagnosis Date  . Cluster headaches   . GERD (gastroesophageal reflux disease)   . Hyperlipidemia   . Hypothyroidism   . Irritable bowel syndrome   . Osteopenia   . Ovarian cyst   . PVC (premature ventricular contraction)    occasional PVCs     Past Surgical History: Past Surgical History:  Procedure Laterality Date  . ANKLE SURGERY     left  . CHOLECYSTECTOMY    . COLONOSCOPY  09/09/2011   Procedure: COLONOSCOPY;  Surgeon: Lafayette Dragon, MD;  Location: [facility];  Service: Endoscopy;  Laterality: N/A;  . DILATION AND CURETTAGE OF UTERUS    . stromal puncture     both eyes     Family History: Family History  Problem Relation Age of Onset  . Colon polyps Father   . Prostate cancer Father   . Crohn's disease Sister     Brother  . Barrett's esophagus Brother   . Stroke      grandparents  . COPD Mother   . Heart disease Mother   . Colon cancer Neg Hx   . Esophageal cancer Neg Hx   . Rectal cancer Neg Hx   . Stomach cancer Neg Hx     Social History: Social History   Social History  . Marital status: Married    Spouse name: N/A  . Number of children: 2  . Years of education: N/A   Occupational History  . retired Education officer, [employer]   Social History Main Topics  . Smoking status: Former Smoker    Types: Cigarettes    Quit date: 02/18/1990  .  Smokeless tobacco: Never Used  . Alcohol use No  . Drug use: No  . Sexual activity: Not Asked   Other Topics Concern  . None   Social History Narrative  . None    Allergies: No Known Allergies  Outpatient Meds: Current Outpatient Prescriptions  Medication Sig Dispense Refill  . calcium carbonate (OS-CAL) 600 MG TABS Take 600 mg by mouth 2 (two) times daily with a meal.    . calcium gluconate 650 MG tablet Take 650 mg by mouth daily.      . cholecalciferol (VITAMIN D) 1000 UNITS tablet Take 1,000 Units by mouth daily.      . cyanocobalamin 500 MCG tablet Take 500 mcg by mouth daily.       Marland Kitchen esomeprazole (NEXIUM) 40 MG capsule Take 1 capsule (40 mg total) by mouth daily before breakfast. 90 capsule 1  . rosuvastatin (CRESTOR) 5 MG tablet Take 5 mg by mouth daily.     No current facility-administered medications for this visit.       ___________________________________________________________________ Objective   Exam:  BP 122/80   Pulse 78   Ht 5\' 4"  (1.626 m)   Wt 226 lb 12.8 oz (102.9 kg)   SpO2 98%   BMI 38.93 kg/m    General: this is a(n) Overweight and otherwise well-appearing woman   Eyes: sclera anicteric, no redness  ENT: oral mucosa moist without lesions, no cervical or supraclavicular lymphadenopathy, good dentition  CV: RRR without murmur, S1/S2, no JVD, no peripheral edema  Resp: clear to auscultation bilaterally, normal RR and effort noted  GI: soft, no tenderness, with active bowel sounds. No guarding or palpable organomegaly noted.  Skin; warm and dry, no rash or jaundice noted  Neuro: awake, alert and oriented x 3. Normal gross motor function and fluent speech  Labs:  PCP notes indicate normal CBC, CMP and TSH on 10/11/2015   Assessment: Encounter Diagnosis  Name Primary?  . Diarrhea of presumed infectious origin    It is not clear how or if this supplement she took is even related. If we assume that it is not, then it is possibly infectious, IBS, microscopic colitis, less likely IBD   Plan: Stool studies for C. difficile and ova and parasites If neg and Bms not back to normal when she calls [place] in a month, schedule colonscopy.  Thank you for the courtesy of this consult.  Please call me with any questions or concerns.  Nelida Meuse III  CC: Tivis Ringer, MD

## 2015-12-05 ENCOUNTER — Other Ambulatory Visit: Payer: Self-pay | Admitting: Gastroenterology

## 2015-12-05 ENCOUNTER — Other Ambulatory Visit: Payer: 59

## 2015-12-05 DIAGNOSIS — R197 Diarrhea, unspecified: Secondary | ICD-10-CM

## 2015-12-06 LAB — C. DIFFICILE GDH AND TOXIN A/B
C. DIFFICILE GDH: NOT DETECTED
C. difficile Toxin A/B: NOT DETECTED

## 2015-12-06 LAB — CLOSTRIDIUM DIFFICILE BY PCR: CDIFFPCR: NOT DETECTED

## 2015-12-07 LAB — OVA AND PARASITE EXAMINATION: OP: NONE SEEN

## 2016-01-01 ENCOUNTER — Telehealth: Payer: Self-pay | Admitting: Gastroenterology

## 2016-01-01 NOTE — Telephone Encounter (Signed)
Called patient back, left her vm, glad she is feeling better. Let her know that if she has questions or concerns to please call our office.

## 2016-01-01 NOTE — Telephone Encounter (Signed)
Noted. Thanks for checking in.

## 2016-03-11 DIAGNOSIS — D485 Neoplasm of uncertain behavior of skin: Secondary | ICD-10-CM | POA: Diagnosis not present

## 2016-03-11 DIAGNOSIS — D2310 Other benign neoplasm of skin of unspecified eyelid, including canthus: Secondary | ICD-10-CM | POA: Diagnosis not present

## 2016-03-11 DIAGNOSIS — L570 Actinic keratosis: Secondary | ICD-10-CM | POA: Diagnosis not present

## 2016-03-11 DIAGNOSIS — D1801 Hemangioma of skin and subcutaneous tissue: Secondary | ICD-10-CM | POA: Diagnosis not present

## 2016-03-11 DIAGNOSIS — L821 Other seborrheic keratosis: Secondary | ICD-10-CM | POA: Diagnosis not present

## 2016-03-15 DIAGNOSIS — H04221 Epiphora due to insufficient drainage, right lacrimal gland: Secondary | ICD-10-CM | POA: Diagnosis not present

## 2016-06-14 DIAGNOSIS — M170 Bilateral primary osteoarthritis of knee: Secondary | ICD-10-CM | POA: Diagnosis not present

## 2016-06-14 DIAGNOSIS — M659 Synovitis and tenosynovitis, unspecified: Secondary | ICD-10-CM | POA: Diagnosis not present

## 2016-06-14 DIAGNOSIS — M25461 Effusion, right knee: Secondary | ICD-10-CM | POA: Diagnosis not present

## 2016-07-12 ENCOUNTER — Encounter: Payer: Self-pay | Admitting: Gastroenterology

## 2016-08-01 ENCOUNTER — Encounter: Payer: Self-pay | Admitting: Gastroenterology

## 2016-08-20 DIAGNOSIS — Z1231 Encounter for screening mammogram for malignant neoplasm of breast: Secondary | ICD-10-CM | POA: Diagnosis not present

## 2016-08-20 DIAGNOSIS — Z01419 Encounter for gynecological examination (general) (routine) without abnormal findings: Secondary | ICD-10-CM | POA: Diagnosis not present

## 2016-09-10 DIAGNOSIS — M7541 Impingement syndrome of right shoulder: Secondary | ICD-10-CM | POA: Diagnosis not present

## 2016-09-10 DIAGNOSIS — M1711 Unilateral primary osteoarthritis, right knee: Secondary | ICD-10-CM | POA: Diagnosis not present

## 2016-09-10 DIAGNOSIS — M25511 Pain in right shoulder: Secondary | ICD-10-CM | POA: Diagnosis not present

## 2016-09-10 DIAGNOSIS — M25461 Effusion, right knee: Secondary | ICD-10-CM | POA: Diagnosis not present

## 2016-10-02 ENCOUNTER — Encounter: Payer: Self-pay | Admitting: Gastroenterology

## 2016-10-02 ENCOUNTER — Ambulatory Visit (AMBULATORY_SURGERY_CENTER): Payer: Self-pay

## 2016-10-02 VITALS — Ht 62.5 in | Wt 221.6 lb

## 2016-10-02 DIAGNOSIS — Z860100 Personal history of colon polyps, unspecified: Secondary | ICD-10-CM

## 2016-10-02 MED ORDER — SUPREP BOWEL PREP KIT 17.5-3.13-1.6 GM/177ML PO SOLN: 1.0000 | Freq: Once | ORAL | 0 refills | Status: AC

## 2016-10-02 NOTE — Progress Notes (Signed)
No allergies to eggs or soy No diet meds No home oxygen No past problems with anesthesia  Registered emmi 

## 2016-10-14 ENCOUNTER — Ambulatory Visit (AMBULATORY_SURGERY_CENTER): Payer: 59 | Admitting: Gastroenterology

## 2016-10-14 ENCOUNTER — Encounter: Payer: Self-pay | Admitting: Gastroenterology

## 2016-10-14 VITALS — BP 132/79 | HR 69 | Temp 98.2°F | Resp 18 | Ht 64.0 in | Wt 226.0 lb

## 2016-10-14 DIAGNOSIS — D122 Benign neoplasm of ascending colon: Secondary | ICD-10-CM

## 2016-10-14 DIAGNOSIS — K635 Polyp of colon: Secondary | ICD-10-CM | POA: Diagnosis not present

## 2016-10-14 DIAGNOSIS — Z86010 Personal history of colonic polyps: Secondary | ICD-10-CM

## 2016-10-14 MED ORDER — SODIUM CHLORIDE 0.9 % IV SOLN: 500.0000 mL | INTRAVENOUS | Status: DC

## 2016-10-14 NOTE — Patient Instructions (Signed)
YOU HAD AN ENDOSCOPIC PROCEDURE TODAY AT THE [facility]:   Refer to the procedure report that was given to you for any specific questions about what was found during the examination.  If the procedure report does not answer your questions, please call your gastroenterologist to clarify.  If you requested that your care partner not be given the details of your procedure findings, then the procedure report has been included in a sealed envelope for you to review at your convenience later.  YOU SHOULD EXPECT: Some feelings of bloating in the abdomen. Passage of more gas than usual.  Walking can help get rid of the air that was put into your GI tract during the procedure and reduce the bloating. If you had a lower endoscopy (such as a colonoscopy or flexible sigmoidoscopy) you may notice spotting of blood in your stool or on the toilet paper. If you underwent a bowel prep for your procedure, you may not have a normal bowel movement for a few days.  Please Note:  You might notice some irritation and congestion in your nose or some drainage.  This is from the oxygen used during your procedure.  There is no need for concern and it should clear up in a day or so.  SYMPTOMS TO REPORT IMMEDIATELY:   Following lower endoscopy (colonoscopy or flexible sigmoidoscopy):  Excessive amounts of blood in the stool  Significant tenderness or worsening of abdominal pains  Swelling of the abdomen that is new, acute  Fever of 100F or higher   For urgent or emergent issues, a gastroenterologist can be reached at any hour by calling [phone].   DIET:  We do recommend a small meal at first, but then you may proceed to your regular diet.  Drink plenty of fluids but you should avoid alcoholic beverages for 24 hours.  ACTIVITY:  You should plan to take it easy for the rest of today and you should NOT DRIVE or use heavy machinery until tomorrow (because of the sedation medicines used during the test).     FOLLOW UP: Our staff will call the number listed on your records the next business day following your procedure to check on you and address any questions or concerns that you may have regarding the information given to you following your procedure. If we do not reach you, we will leave a message.  However, if you are feeling well and you are not experiencing any problems, there is no need to return our call.  We will assume that you have returned to your regular daily activities without incident.  If any biopsies were taken you will be contacted by phone or by letter within the next 1-3 weeks.  Please call us at [phone] if you have not heard about the biopsies in 3 weeks.    SIGNATURES/CONFIDENTIALITY: You and/or your care partner have signed paperwork which will be entered into your electronic medical record.  These signatures attest to the fact that that the information above on your After Visit Summary has been reviewed and is understood.  Full responsibility of the confidentiality of this discharge information lies with you and/or your care-partner.   Handout was given to your care partner on polyps. You may resume your current medications today. Await biopsy results. Please call if any questions or concerns.   

## 2016-10-14 NOTE — Progress Notes (Signed)
Called to room to assist during endoscopic procedure.  Patient ID and intended procedure confirmed with present staff. Received instructions for my participation in the procedure from the performing physician.  

## 2016-10-14 NOTE — Op Note (Signed)
[facility] Patient Name: Maria Wade Procedure Date: 10/14/2016 9:56 AM MRN: [id number] Endoscopist: Mallie Mussel L. Loletha Carrow , MD Age: 61 Referring MD:  Date of Birth: Jan 15, 1956 Gender: Female Account #: [id number] Procedure:                Colonoscopy Indications:              Surveillance: Personal history of adenomatous                            polyps on last colonoscopy 5 years ago (62mm TA                            08/2011) Medicines:                Monitored Anesthesia Care Procedure:                Pre-Anesthesia Assessment:                           - Prior to the procedure, a History and Physical                            was performed, and patient medications and                            allergies were reviewed. The patient's tolerance of                            previous anesthesia was also reviewed. The risks                            and benefits of the procedure and the sedation                            options and risks were discussed with the patient.                            All questions were answered, and informed consent                            was obtained. Prior Anticoagulants: The patient has                            taken no previous anticoagulant or antiplatelet                            agents. ASA Grade Assessment: II - A patient with                            mild systemic disease. After reviewing the risks                            and benefits, the patient was deemed in  satisfactory condition to undergo the procedure.                           After obtaining informed consent, the colonoscope                            was passed under direct vision. Throughout the                            procedure, the patient's blood pressure, pulse, and                            oxygen saturations were monitored continuously. The                            Model CF-HQ190L [id number]) scope was introduced                         through the anus and advanced to the the cecum,                            identified by appendiceal orifice and ileocecal                            valve. The colonoscopy was performed without                            difficulty. The patient tolerated the procedure                            well. The quality of the bowel preparation was                            excellent. The ileocecal valve, appendiceal                            orifice, and rectum were photographed. The quality                            of the bowel preparation was evaluated using the                            BBPS [facility] Bowel Preparation Scale) with scores                            of: Right Colon = 3, Transverse Colon = 3 and Left                            Colon = 3. The total BBPS score equals 9. The bowel                            preparation used was SUPREP. Scope In: 10:12:12 AM Scope Out: 10:26:40 AM Scope Withdrawal Time: 0 hours 11 minutes 27 seconds  Total  Procedure Duration: 0 hours 14 minutes 28 seconds  Findings:                 The perianal and digital rectal examinations were                            normal.                           A 4 mm polyp was found in the proximal ascending                            colon. The polyp was sessile. The polyp was removed                            with a cold snare. Resection and retrieval were                            complete.                           The exam was otherwise without abnormality on                            direct and retroflexion views. Complications:            No immediate complications. Estimated Blood Loss:     Estimated blood loss: none. Impression:               - One 4 mm polyp in the proximal ascending colon,                            removed with a cold snare. Resected and retrieved.                           - The examination was otherwise normal on direct                            and  retroflexion views. Recommendation:           - Patient has a contact number available for                            emergencies. The signs and symptoms of potential                            delayed complications were discussed with the                            patient. Return to normal activities tomorrow.                            Written discharge instructions were provided to the                            patient.                           -  Resume previous diet.                           - Continue present medications.                           - Await pathology results.                           - Repeat colonoscopy is recommended for                            surveillance. The colonoscopy date will be                            determined after pathology results from today's                            exam become available for review. Henry L. Loletha Carrow, MD 10/14/2016 10:30:54 AM This report has been signed electronically.

## 2016-10-14 NOTE — Progress Notes (Signed)
No problems noted in the recovery room. maw 

## 2016-10-14 NOTE — Progress Notes (Signed)
To recovery, report to RN, VSS. 

## 2016-10-14 NOTE — Progress Notes (Signed)
Pt's states no medical or surgical changes since previsit or office visit. 

## 2016-10-15 ENCOUNTER — Telehealth: Payer: Self-pay

## 2016-10-15 NOTE — Telephone Encounter (Signed)
  Follow up Call-  Call back number 10/14/2016  Post procedure Call Back phone  # [phone]  Permission to leave phone message Yes  Some recent data might be hidden     Patient questions:  Do you have a fever, pain , or abdominal swelling? No. Pain Score  0 *  Have you tolerated food without any problems? Yes.    Have you been able to return to your normal activities? Yes.    Do you have any questions about your discharge instructions: Diet   No. Medications  No. Follow up visit  No.  Do you have questions or concerns about your Care? No.  Actions: * If pain score is 4 or above: No action needed, pain <4.

## 2016-10-17 ENCOUNTER — Encounter: Payer: Self-pay | Admitting: Gastroenterology

## 2016-11-11 DIAGNOSIS — N390 Urinary tract infection, site not specified: Secondary | ICD-10-CM | POA: Diagnosis not present

## 2016-11-11 DIAGNOSIS — Z0000 Encounter for general adult medical examination without abnormal findings: Secondary | ICD-10-CM | POA: Diagnosis not present

## 2016-11-11 DIAGNOSIS — E038 Other specified hypothyroidism: Secondary | ICD-10-CM | POA: Diagnosis not present

## 2016-11-18 DIAGNOSIS — R030 Elevated blood-pressure reading, without diagnosis of hypertension: Secondary | ICD-10-CM | POA: Diagnosis not present

## 2016-11-18 DIAGNOSIS — Z0000 Encounter for general adult medical examination without abnormal findings: Secondary | ICD-10-CM | POA: Diagnosis not present

## 2016-11-18 DIAGNOSIS — K635 Polyp of colon: Secondary | ICD-10-CM | POA: Diagnosis not present

## 2016-11-20 DIAGNOSIS — Z1212 Encounter for screening for malignant neoplasm of rectum: Secondary | ICD-10-CM | POA: Diagnosis not present

## 2016-12-11 DIAGNOSIS — M25572 Pain in left ankle and joints of left foot: Secondary | ICD-10-CM | POA: Diagnosis not present

## 2016-12-11 DIAGNOSIS — M25561 Pain in right knee: Secondary | ICD-10-CM | POA: Diagnosis not present

## 2016-12-11 DIAGNOSIS — M19172 Post-traumatic osteoarthritis, left ankle and foot: Secondary | ICD-10-CM | POA: Diagnosis not present

## 2016-12-11 DIAGNOSIS — G8929 Other chronic pain: Secondary | ICD-10-CM | POA: Diagnosis not present

## 2017-02-17 DIAGNOSIS — R9082 White matter disease, unspecified: Secondary | ICD-10-CM | POA: Diagnosis not present

## 2017-02-21 DIAGNOSIS — M25572 Pain in left ankle and joints of left foot: Secondary | ICD-10-CM | POA: Diagnosis not present

## 2017-02-21 DIAGNOSIS — M19272 Secondary osteoarthritis, left ankle and foot: Secondary | ICD-10-CM | POA: Diagnosis not present

## 2017-03-17 DIAGNOSIS — L570 Actinic keratosis: Secondary | ICD-10-CM | POA: Diagnosis not present

## 2017-03-17 DIAGNOSIS — L821 Other seborrheic keratosis: Secondary | ICD-10-CM | POA: Diagnosis not present

## 2017-03-17 DIAGNOSIS — Z808 Family history of malignant neoplasm of other organs or systems: Secondary | ICD-10-CM | POA: Diagnosis not present

## 2017-03-21 DIAGNOSIS — M25562 Pain in left knee: Secondary | ICD-10-CM | POA: Diagnosis not present

## 2017-03-21 DIAGNOSIS — M13861 Other specified arthritis, right knee: Secondary | ICD-10-CM | POA: Diagnosis not present

## 2017-03-21 DIAGNOSIS — M25561 Pain in right knee: Secondary | ICD-10-CM | POA: Diagnosis not present

## 2017-07-09 DIAGNOSIS — M25562 Pain in left knee: Secondary | ICD-10-CM | POA: Diagnosis not present

## 2017-07-09 DIAGNOSIS — M25561 Pain in right knee: Secondary | ICD-10-CM | POA: Diagnosis not present

## 2017-09-03 DIAGNOSIS — Z01419 Encounter for gynecological examination (general) (routine) without abnormal findings: Secondary | ICD-10-CM | POA: Diagnosis not present

## 2017-10-01 DIAGNOSIS — M25561 Pain in right knee: Secondary | ICD-10-CM | POA: Diagnosis not present

## 2017-10-01 DIAGNOSIS — M1712 Unilateral primary osteoarthritis, left knee: Secondary | ICD-10-CM | POA: Diagnosis not present

## 2017-10-01 DIAGNOSIS — M25562 Pain in left knee: Secondary | ICD-10-CM | POA: Diagnosis not present

## 2017-10-01 DIAGNOSIS — M1711 Unilateral primary osteoarthritis, right knee: Secondary | ICD-10-CM | POA: Diagnosis not present

## 2017-10-31 DIAGNOSIS — J3489 Other specified disorders of nose and nasal sinuses: Secondary | ICD-10-CM | POA: Diagnosis not present

## 2017-11-20 ENCOUNTER — Other Ambulatory Visit: Payer: Self-pay | Admitting: Internal Medicine

## 2017-11-20 DIAGNOSIS — R9082 White matter disease, unspecified: Secondary | ICD-10-CM

## 2017-12-08 ENCOUNTER — Ambulatory Visit
Admission: RE | Admit: 2017-12-08 | Discharge: 2017-12-08 | Disposition: A | Discharge status: Home / Self Care | Payer: 59 | Source: Ambulatory Visit | Attending: Internal Medicine | Admitting: Internal Medicine

## 2017-12-08 DIAGNOSIS — R9082 White matter disease, unspecified: Secondary | ICD-10-CM

## 2017-12-08 DIAGNOSIS — G35 Multiple sclerosis: Secondary | ICD-10-CM | POA: Diagnosis not present

## 2017-12-08 MED ORDER — GADOBENATE DIMEGLUMINE 529 MG/ML IV SOLN
19.0000 mL | Freq: Once | INTRAVENOUS | Status: AC | PRN
  Administered 2017-12-08: 19 mL via INTRAVENOUS

## 2017-12-15 DIAGNOSIS — R82998 Other abnormal findings in urine: Secondary | ICD-10-CM | POA: Diagnosis not present

## 2017-12-15 DIAGNOSIS — Z0000 Encounter for general adult medical examination without abnormal findings: Secondary | ICD-10-CM | POA: Diagnosis not present

## 2017-12-15 DIAGNOSIS — E038 Other specified hypothyroidism: Secondary | ICD-10-CM | POA: Diagnosis not present

## 2017-12-23 DIAGNOSIS — Z1389 Encounter for screening for other disorder: Secondary | ICD-10-CM | POA: Diagnosis not present

## 2017-12-23 DIAGNOSIS — R030 Elevated blood-pressure reading, without diagnosis of hypertension: Secondary | ICD-10-CM | POA: Diagnosis not present

## 2017-12-23 DIAGNOSIS — R002 Palpitations: Secondary | ICD-10-CM | POA: Diagnosis not present

## 2017-12-23 DIAGNOSIS — Z0000 Encounter for general adult medical examination without abnormal findings: Secondary | ICD-10-CM | POA: Diagnosis not present

## 2017-12-29 DIAGNOSIS — M170 Bilateral primary osteoarthritis of knee: Secondary | ICD-10-CM | POA: Diagnosis not present

## 2017-12-29 DIAGNOSIS — M25561 Pain in right knee: Secondary | ICD-10-CM | POA: Diagnosis not present

## 2017-12-29 DIAGNOSIS — M25562 Pain in left knee: Secondary | ICD-10-CM | POA: Diagnosis not present

## 2018-01-02 DIAGNOSIS — Z1212 Encounter for screening for malignant neoplasm of rectum: Secondary | ICD-10-CM | POA: Diagnosis not present

## 2018-02-23 DIAGNOSIS — R9082 White matter disease, unspecified: Secondary | ICD-10-CM | POA: Diagnosis not present

## 2018-03-30 DIAGNOSIS — M1711 Unilateral primary osteoarthritis, right knee: Secondary | ICD-10-CM | POA: Diagnosis not present

## 2018-03-30 DIAGNOSIS — M25562 Pain in left knee: Secondary | ICD-10-CM | POA: Diagnosis not present

## 2018-03-30 DIAGNOSIS — M25561 Pain in right knee: Secondary | ICD-10-CM | POA: Diagnosis not present

## 2018-06-29 DIAGNOSIS — M1712 Unilateral primary osteoarthritis, left knee: Secondary | ICD-10-CM | POA: Diagnosis not present

## 2018-06-29 DIAGNOSIS — M25562 Pain in left knee: Secondary | ICD-10-CM | POA: Diagnosis not present

## 2018-06-29 DIAGNOSIS — M25561 Pain in right knee: Secondary | ICD-10-CM | POA: Diagnosis not present

## 2018-12-08 IMAGING — MR MR HEAD WO/W CM
13 series · 48 of 48 positions shown · IV contrast (multihance)
Comparison: 03/14/2014.  11/09/2012.

CLINICAL DATA: Follow-up multiple sclerosis asymptomatic presently.

Creatinine was obtained on site at [HOSPITAL] at [HOSPITAL].
Results: Creatinine 0.7 mg/dL.
EXAM:
MRI HEAD WITHOUT AND WITH CONTRAST
TECHNIQUE: Multiplanar, multiecho pulse sequences of the brain and surrounding
structures were obtained without and with intravenous contrast.
CONTRAST:  19mL MULTIHANCE GADOBENATE DIMEGLUMINE 529 MG/ML IV SOLN

[Series 2: t1_se_sag · sagittal · 5.0mm · 0.45mm/px · 1 of 21 slices shown]
[im 1/21]
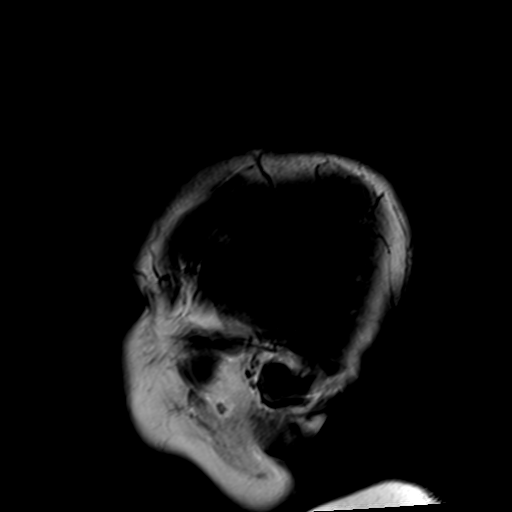

[Series 3: ep2d_diff_(id)_trace · axial · 3.0mm · 1.80mm/px · z∈[-45,+104]mm · 5 of 100 slices shown]
[im 1/100]
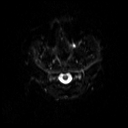
[im 25/100]
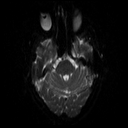
[im 50/100]
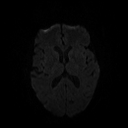
[im 75/100]
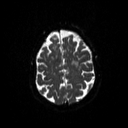
[im 100/100]
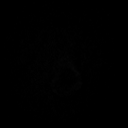

[Series 4: ep2d_diff_(id)_trace_adc · axial · 3.0mm · 1.80mm/px · z∈[-45,+104]mm · 2 of 51 slices shown]
[im 1/51]
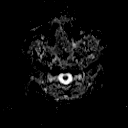
[im 51/51]
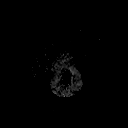

[Series 5: ep2d_diff_cor · coronal · 5.0mm · 1.77mm/px · 3 of 55 slices shown]
[im 1/55]
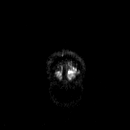
[im 28/55]
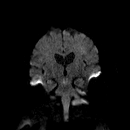
[im 55/55]
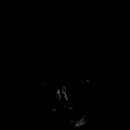

[Series 6: ep2d_diff_cor_adc · coronal · 5.0mm · 1.77mm/px · 2 of 28 slices shown]
[im 1/28]
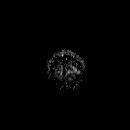
[im 28/28]
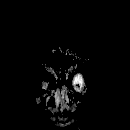

[Series 8: swi_images · axial · 2.0mm · 0.90mm/px · z∈[-55,+103]mm · 5 of 80 slices shown]
[im 1/80]
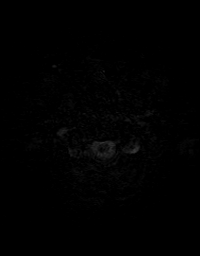
[im 20/80]
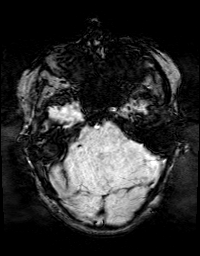
[im 40/80]
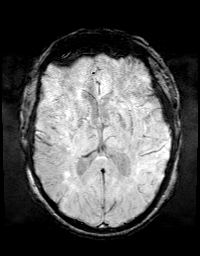
[im 60/80]
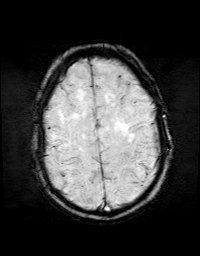
[im 80/80]
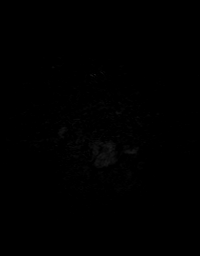

[Series 9: FLAIR · axial · 3.0mm · 0.43mm/px · z∈[-55,+101]mm · 2 of 27 slices shown (1 of 2)]
[im 1/27]
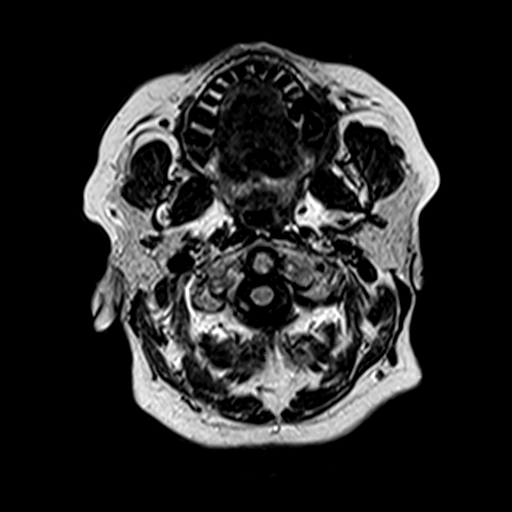
[im 27/27]
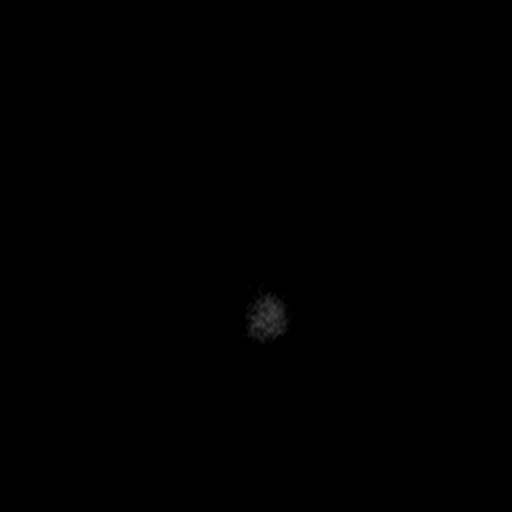

[Series 10: t2_tse_tra_512 · axial · 5.0mm · 0.60mm/px · z∈[-56,+100]mm · 2 of 27 slices shown]
[im 1/27]
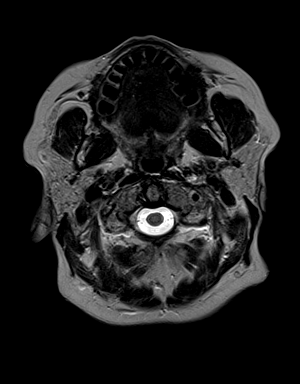
[im 27/27]
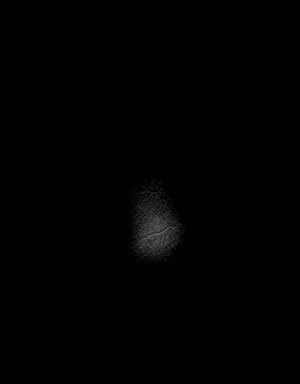

[Series 11: t1_mpr_tra · axial · 1.0mm · 0.72mm/px · z∈[-57,+101]mm · 10 of 160 slices shown]
[im 1/160]
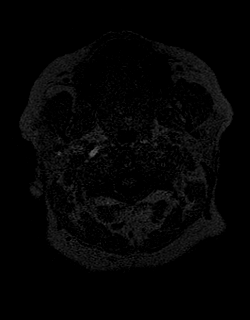
[im 18/160]
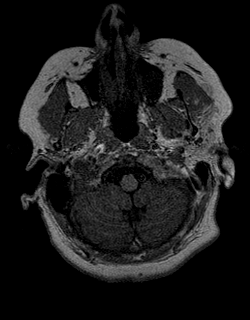
[im 36/160]
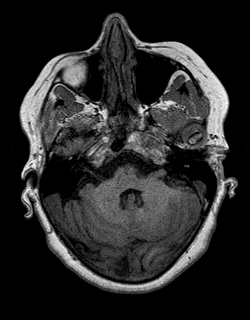
[im 54/160]
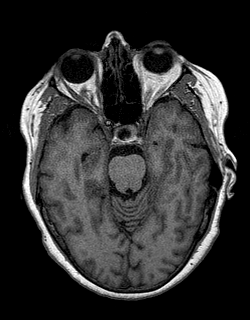
[im 71/160]
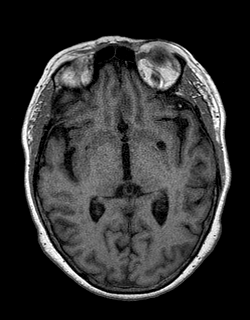
[im 89/160]
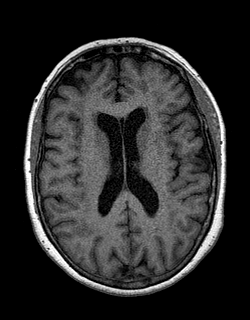
[im 107/160]
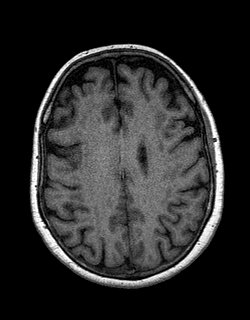
[im 124/160]
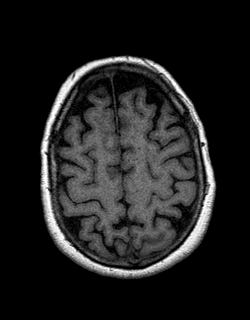
[im 142/160]
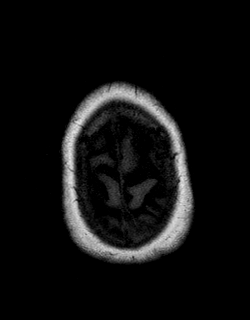
[im 160/160]
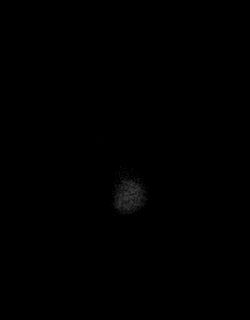

[Series 12: FLAIR · sagittal · 5.0mm · 0.45mm/px · 2 of 25 slices shown (2 of 2)]
[im 1/25]
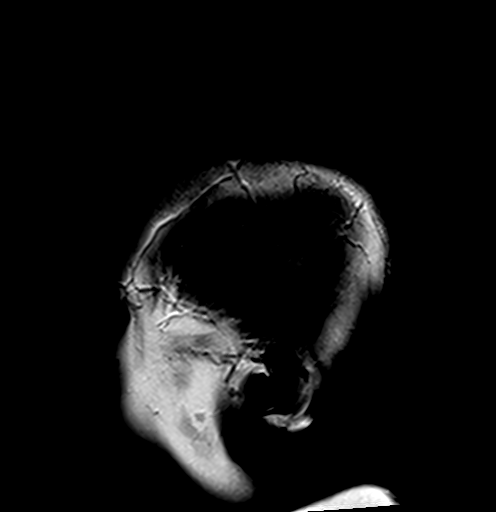
[im 25/25]
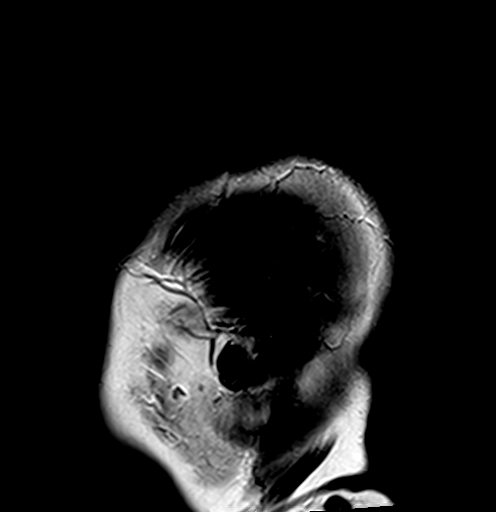

[Series 13: T2 · coronal · 5.0mm · 0.45mm/px · 2 of 29 slices shown]
[im 1/29]
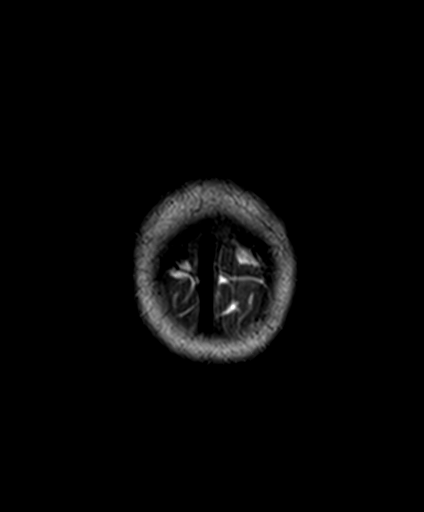
[im 29/29]
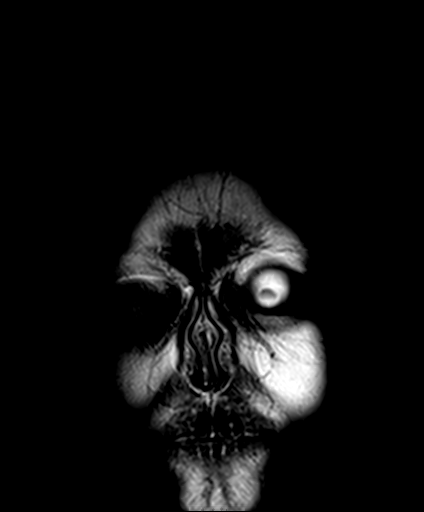

[Series 14: T1 post-contrast · coronal · 5.0mm · 0.72mm/px · 2 of 29 slices shown]
[im 1/29]
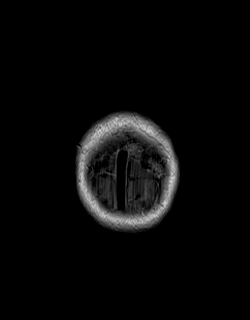
[im 29/29]
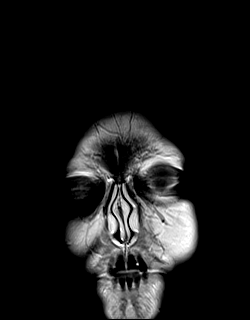

[Series 15: post t1_mpr_tra · axial · 1.0mm · 0.72mm/px · z∈[-57,+101]mm · 10 of 160 slices shown]
[im 1/160]
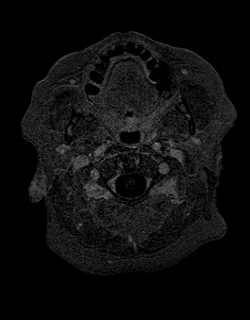
[im 18/160]
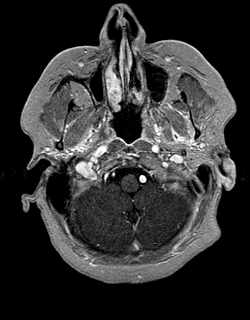
[im 36/160]
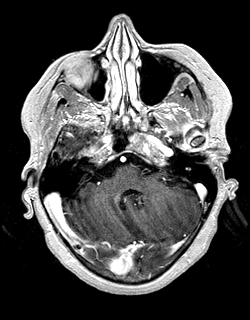
[im 54/160]
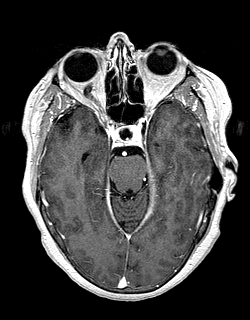
[im 71/160]
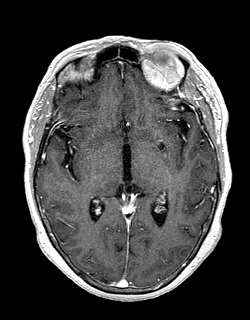
[im 89/160]
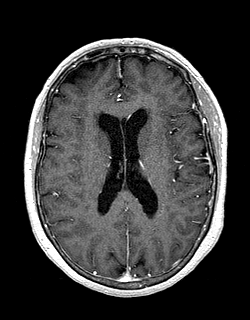
[im 107/160]
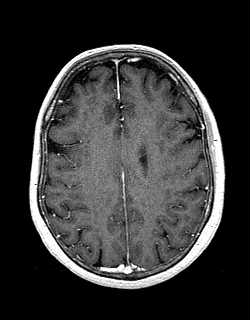
[im 124/160]
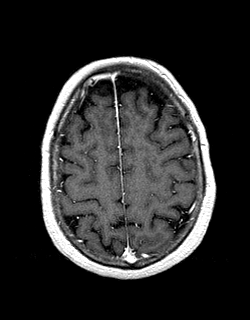
[im 142/160]
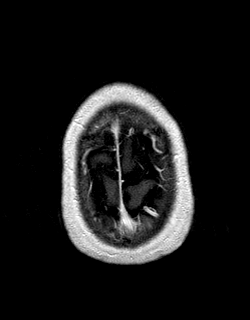
[im 160/160]
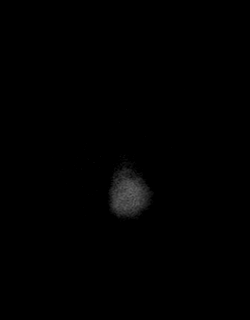

[48 of 48 positions shown; findings below may reference images not displayed]

FINDINGS: Brain: Diffusion imaging does not show any acute or subacute
infarction or other cause of restricted diffusion. Multiple foci of
abnormal FLAIR and T2 signal throughout the cerebral hemispheric
white matter appear stable. No new or progressive lesions. No mass
lesion, hemorrhage, hydrocephalus or extra-axial collection.

Vascular: Major vessels at the base of the brain show flow.

Skull and upper cervical spine: Negative

Sinuses/Orbits: Clear/normal. Insignificant retention cyst left
maxillary sinus floor. Hypoplastic right maxillary sinus.

Other: None
IMPRESSION: No new or progressive finding. Multiple foci of abnormal T2 and
FLAIR signal affecting the cerebral hemispheres consistent with
chronic demyelinating disease. No lesions show restricted diffusion
or contrast enhancement.

## 2019-05-20 ENCOUNTER — Other Ambulatory Visit: Payer: Self-pay | Admitting: Otolaryngology

## 2019-05-21 ENCOUNTER — Other Ambulatory Visit: Payer: Self-pay | Admitting: Otolaryngology

## 2019-05-26 ENCOUNTER — Other Ambulatory Visit: Payer: Self-pay | Admitting: Otolaryngology

## 2019-05-26 DIAGNOSIS — IMO0001 Reserved for inherently not codable concepts without codable children: Secondary | ICD-10-CM

## 2019-05-26 DIAGNOSIS — H9042 Sensorineural hearing loss, unilateral, left ear, with unrestricted hearing on the contralateral side: Secondary | ICD-10-CM

## 2019-05-26 DIAGNOSIS — H9312 Tinnitus, left ear: Secondary | ICD-10-CM

## 2019-06-26 ENCOUNTER — Ambulatory Visit
Admission: RE | Admit: 2019-06-26 | Discharge: 2019-06-26 | Disposition: A | Discharge status: Home / Self Care | Payer: 59 | Source: Ambulatory Visit | Attending: Otolaryngology | Admitting: Otolaryngology

## 2019-06-26 DIAGNOSIS — IMO0001 Reserved for inherently not codable concepts without codable children: Secondary | ICD-10-CM

## 2019-06-26 DIAGNOSIS — H9312 Tinnitus, left ear: Secondary | ICD-10-CM

## 2019-06-26 DIAGNOSIS — H9042 Sensorineural hearing loss, unilateral, left ear, with unrestricted hearing on the contralateral side: Secondary | ICD-10-CM

## 2019-06-26 MED ORDER — GADOBENATE DIMEGLUMINE 529 MG/ML IV SOLN
20.0000 mL | Freq: Once | INTRAVENOUS | Status: AC | PRN
  Administered 2019-06-26: 09:00:00 20 mL via INTRAVENOUS

## 2020-01-10 ENCOUNTER — Other Ambulatory Visit: Payer: Self-pay | Admitting: Family

## 2020-01-10 ENCOUNTER — Telehealth (HOSPITAL_COMMUNITY): Payer: Self-pay

## 2020-01-10 DIAGNOSIS — U071 COVID-19: Secondary | ICD-10-CM

## 2020-01-10 NOTE — Telephone Encounter (Signed)
Called to discuss with patient about Covid symptoms and the use of the monoclonal antibody infusion for those with mild to moderate Covid symptoms and at a high risk of hospitalization.     Pt appears to qualify for this infusion due to co-morbid conditions and/or a member of an at-risk group in accordance with the FDA Emergency Use Authorization.    Risk factors include: Age, BMI over 25   Symptom onset: 11/17. Sx include sore throat and chest tightness   Tested positive for COVID 19: (+) test 11/18 at [facility], will bring in test results.    Discussed information regarding costs of monoclonal antibody treatment, given both CPT & REV codes, and encouraged patient to call their health insurance company to verify cost of treatment that pt will be financially responsible for.  Patient aware they will receive a call from APP for further information and to schedule appointment. All questions answered.

## 2020-01-10 NOTE — Progress Notes (Signed)
I connected by phone with Maria Wade on 01/10/2020 at 5:19 PM to discuss the potential use of a new treatment for mild to moderate COVID-19 viral infection in non-hospitalized patients.  This patient is a 64 y.o. female that meets the FDA criteria for Emergency Use Authorization of COVID monoclonal antibody casirivimab/imdevimab, bamlanivimab/eteseviamb, or sotrovimab.  Has a (+) direct SARS-CoV-2 viral test result  Has mild or moderate COVID-19   Is NOT hospitalized due to COVID-19  Is within 10 days of symptom onset  Has at least one of the high risk factor(s) for progression to severe COVID-19 and/or hospitalization as defined in EUA.  Specific high risk criteria : Older age (>/= 65 yo) and BMI > 25   Symptoms of sore throat, chest tightness, and cough began 01/05/20.   I have spoken and communicated the following to the patient or parent/caregiver regarding COVID monoclonal antibody treatment:  1. FDA has authorized the emergency use for the treatment of mild to moderate COVID-19 in adults and pediatric patients with positive results of direct SARS-CoV-2 viral testing who are 47 years of age and older weighing at least 40 kg, and who are at high risk for progressing to severe COVID-19 and/or hospitalization.  2. The significant known and potential risks and benefits of COVID monoclonal antibody, and the extent to which such potential risks and benefits are unknown.  3. Information on available alternative treatments and the risks and benefits of those alternatives, including clinical trials.  4. Patients treated with COVID monoclonal antibody should continue to self-isolate and use infection control measures (e.g., wear mask, isolate, social distance, avoid sharing personal items, clean and disinfect "high touch" surfaces, and frequent handwashing) according to CDC guidelines.   5. The patient or parent/caregiver has the option to accept or refuse COVID monoclonal antibody  treatment.  After reviewing this information with the patient, the patient has agreed to receive one of the available covid 19 monoclonal antibodies and will be provided an appropriate fact sheet prior to infusion. Asencion Gowda, NP 01/10/2020 5:19 PM

## 2020-01-11 ENCOUNTER — Ambulatory Visit (HOSPITAL_COMMUNITY)
Admission: RE | Admit: 2020-01-11 | Discharge: 2020-01-11 | Disposition: A | Discharge status: Home / Self Care | Payer: 59 | Source: Ambulatory Visit | Attending: Pulmonary Disease | Admitting: Pulmonary Disease

## 2020-01-11 DIAGNOSIS — U071 COVID-19: Secondary | ICD-10-CM

## 2020-01-11 MED ORDER — ALBUTEROL SULFATE HFA 108 (90 BASE) MCG/ACT IN AERS: 2.0000 | INHALATION_SPRAY | Freq: Once | RESPIRATORY_TRACT | Status: DC | PRN

## 2020-01-11 MED ORDER — DIPHENHYDRAMINE HCL 50 MG/ML IJ SOLN: 50.0000 mg | Freq: Once | INTRAMUSCULAR | Status: DC | PRN

## 2020-01-11 MED ORDER — SODIUM CHLORIDE 0.9 % IV SOLN: INTRAVENOUS | Status: DC | PRN

## 2020-01-11 MED ORDER — FAMOTIDINE IN NACL 20-0.9 MG/50ML-% IV SOLN: 20.0000 mg | Freq: Once | INTRAVENOUS | Status: DC | PRN

## 2020-01-11 MED ORDER — METHYLPREDNISOLONE SODIUM SUCC 125 MG IJ SOLR: 125.0000 mg | Freq: Once | INTRAMUSCULAR | Status: DC | PRN

## 2020-01-11 MED ORDER — EPINEPHRINE 0.3 MG/0.3ML IJ SOAJ: 0.3000 mg | Freq: Once | INTRAMUSCULAR | Status: DC | PRN

## 2020-01-11 MED ORDER — SOTROVIMAB 500 MG/8ML IV SOLN
500.0000 mg | Freq: Once | INTRAVENOUS | Status: AC
  Administered 2020-01-11: 500 mg via INTRAVENOUS

## 2020-01-11 NOTE — Progress Notes (Signed)
  Diagnosis: COVID-19  Physician: Dr. Asencion Noble  Procedure: Medication fact sheet provided to patient; all questions answered.  Allergies reviewed with patient.  IV placed.  Sotrovimab administered via IV infusion.  Complications: No immediate complications noted.  Discharge: Discharged home   Monna Fam 01/11/2020

## 2020-01-11 NOTE — Progress Notes (Signed)
Patient reviewed Fact Sheet for Patients, Parents, and Caregivers for Emergency Use Authorization (EUA) of Sotrovimab for the Treatment of Coronavirus. Patient also reviewed and is agreeable to the estimated cost of treatment. Patient is agreeable to proceed.   

## 2020-01-11 NOTE — Discharge Instructions (Signed)

## 2020-01-27 ENCOUNTER — Other Ambulatory Visit: Payer: Self-pay | Admitting: Internal Medicine

## 2020-01-27 DIAGNOSIS — E785 Hyperlipidemia, unspecified: Secondary | ICD-10-CM

## 2020-02-16 ENCOUNTER — Ambulatory Visit
Admission: RE | Admit: 2020-02-16 | Discharge: 2020-02-16 | Disposition: A | Discharge status: Home / Self Care | Payer: Self-pay | Source: Ambulatory Visit | Attending: Internal Medicine | Admitting: Internal Medicine

## 2020-02-16 DIAGNOSIS — E785 Hyperlipidemia, unspecified: Secondary | ICD-10-CM

## 2020-09-20 ENCOUNTER — Ambulatory Visit: Payer: 59 | Admitting: Orthopaedic Surgery

## 2021-01-25 ENCOUNTER — Other Ambulatory Visit: Payer: Self-pay | Admitting: Obstetrics and Gynecology

## 2021-01-25 DIAGNOSIS — R928 Other abnormal and inconclusive findings on diagnostic imaging of breast: Secondary | ICD-10-CM

## 2021-02-27 ENCOUNTER — Ambulatory Visit
Admission: RE | Admit: 2021-02-27 | Discharge: 2021-02-27 | Disposition: A | Discharge status: Home / Self Care | Payer: No Typology Code available for payment source | Source: Ambulatory Visit | Attending: Obstetrics and Gynecology | Admitting: Obstetrics and Gynecology

## 2021-02-27 ENCOUNTER — Ambulatory Visit: Payer: No Typology Code available for payment source

## 2021-02-27 DIAGNOSIS — R928 Other abnormal and inconclusive findings on diagnostic imaging of breast: Secondary | ICD-10-CM

## 2021-03-06 ENCOUNTER — Other Ambulatory Visit: Payer: No Typology Code available for payment source

## 2021-03-09 ENCOUNTER — Other Ambulatory Visit: Payer: Self-pay | Admitting: Internal Medicine

## 2021-03-09 DIAGNOSIS — I10 Essential (primary) hypertension: Secondary | ICD-10-CM

## 2021-03-13 ENCOUNTER — Other Ambulatory Visit: Payer: Self-pay | Admitting: Internal Medicine

## 2021-03-13 DIAGNOSIS — I7121 Aneurysm of the ascending aorta, without rupture: Secondary | ICD-10-CM

## 2021-03-30 ENCOUNTER — Other Ambulatory Visit: Payer: Self-pay | Admitting: Internal Medicine

## 2021-03-30 DIAGNOSIS — I7121 Aneurysm of the ascending aorta, without rupture: Secondary | ICD-10-CM

## 2021-04-30 ENCOUNTER — Ambulatory Visit
Admission: RE | Admit: 2021-04-30 | Discharge: 2021-04-30 | Disposition: A | Discharge status: Home / Self Care | Payer: No Typology Code available for payment source | Source: Ambulatory Visit | Attending: Internal Medicine | Admitting: Internal Medicine

## 2021-04-30 DIAGNOSIS — I7121 Aneurysm of the ascending aorta, without rupture: Secondary | ICD-10-CM

## 2021-04-30 MED ORDER — IOPAMIDOL (ISOVUE-370) INJECTION 76%
75.0000 mL | Freq: Once | INTRAVENOUS | Status: AC | PRN
  Administered 2021-04-30: 75 mL via INTRAVENOUS

## 2021-11-06 ENCOUNTER — Encounter: Payer: Self-pay | Admitting: Gastroenterology

## 2021-12-03 ENCOUNTER — Telehealth: Payer: Self-pay | Admitting: *Deleted

## 2021-12-03 ENCOUNTER — Ambulatory Visit (AMBULATORY_SURGERY_CENTER): Payer: Self-pay | Admitting: *Deleted

## 2021-12-03 VITALS — Ht 64.0 in | Wt 228.8 lb

## 2021-12-03 DIAGNOSIS — Z86010 Personal history of colonic polyps: Secondary | ICD-10-CM

## 2021-12-03 MED ORDER — NA SULFATE-K SULFATE-MG SULF 17.5-3.13-1.6 GM/177ML PO SOLN: 1.0000 | Freq: Once | ORAL | 0 refills | Status: AC

## 2021-12-03 NOTE — Progress Notes (Signed)
No egg or soy allergy known to patient  No issues known to pt with past sedation with any surgeries or procedures Patient denies ever being told they had issues or difficulty with intubation  No FH of Malignant Hyperthermia Pt is not on diet pills Pt is not on  home 02  Pt is not on blood thinners  Pt denies issues with constipation  No A fib or A flutter Have any cardiac testing pending--no Pt instructed to use Singlecare.com or GoodRx for a price reduction on prep    Pt.requesting egd,h/o gerd,family h/o barrett's in brother,TE to Dr. Loletha Carrow

## 2021-12-03 NOTE — Telephone Encounter (Signed)
Pt. In for pre-visit today has a h/o gerd on ppi for over 20 years,brother has h/o barrett's,pt.requesting to have egd with,scheduled colon on 12/31/21

## 2021-12-04 NOTE — Telephone Encounter (Signed)
Thank you for the note.  Yes, she can also have an upper endoscopy the day of her colonoscopy if the schedule allows. She must be made aware that it would be a diagnostic procedure, potentially affecting her insurance coverage and deductible if done at the same time as a colonoscopy for history of colon polyps.  Usual preprocedure insurance review per our usual praxis, but I recommend she also contact her insurance company so she will be aware.  If she is not comfortable with that plan, then just schedule the colonoscopy and we can discuss the GERD and EGD in the office at a later date.  - HD

## 2021-12-05 NOTE — Telephone Encounter (Signed)
Pt. Notified of Dr. Loletha Carrow recommendations,she decided to call her insurance company first and see if they would cover the procedure and then she will call scheduler and let them know if she would  like to proceed with EGD or remain scheduled for colonoscopy on 12/31/21.

## 2021-12-21 ENCOUNTER — Encounter: Payer: Self-pay | Admitting: Gastroenterology

## 2021-12-31 ENCOUNTER — Encounter: Payer: No Typology Code available for payment source | Admitting: Gastroenterology

## 2021-12-31 ENCOUNTER — Ambulatory Visit (AMBULATORY_SURGERY_CENTER): Payer: No Typology Code available for payment source | Admitting: Gastroenterology

## 2021-12-31 ENCOUNTER — Encounter: Payer: Self-pay | Admitting: Gastroenterology

## 2021-12-31 VITALS — BP 130/78 | HR 82 | Temp 97.5°F | Resp 15 | Ht 64.0 in | Wt 228.8 lb

## 2021-12-31 DIAGNOSIS — Z86010 Personal history of colonic polyps: Secondary | ICD-10-CM

## 2021-12-31 DIAGNOSIS — Z09 Encounter for follow-up examination after completed treatment for conditions other than malignant neoplasm: Secondary | ICD-10-CM | POA: Diagnosis present

## 2021-12-31 DIAGNOSIS — K317 Polyp of stomach and duodenum: Secondary | ICD-10-CM | POA: Diagnosis not present

## 2021-12-31 DIAGNOSIS — D124 Benign neoplasm of descending colon: Secondary | ICD-10-CM

## 2021-12-31 DIAGNOSIS — Z1211 Encounter for screening for malignant neoplasm of colon: Secondary | ICD-10-CM

## 2021-12-31 DIAGNOSIS — D125 Benign neoplasm of sigmoid colon: Secondary | ICD-10-CM | POA: Diagnosis not present

## 2021-12-31 DIAGNOSIS — Z8379 Family history of other diseases of the digestive system: Secondary | ICD-10-CM

## 2021-12-31 DIAGNOSIS — D123 Benign neoplasm of transverse colon: Secondary | ICD-10-CM | POA: Diagnosis not present

## 2021-12-31 DIAGNOSIS — K219 Gastro-esophageal reflux disease without esophagitis: Secondary | ICD-10-CM

## 2021-12-31 DIAGNOSIS — R12 Heartburn: Secondary | ICD-10-CM

## 2021-12-31 DIAGNOSIS — D122 Benign neoplasm of ascending colon: Secondary | ICD-10-CM

## 2021-12-31 MED ORDER — SODIUM CHLORIDE 0.9 % IV SOLN: 500.0000 mL | Freq: Once | INTRAVENOUS | Status: DC

## 2021-12-31 NOTE — Patient Instructions (Signed)
Handout given on polyps.  YOU HAD AN ENDOSCOPIC PROCEDURE TODAY AT [facility]:   Refer to the procedure report that was given to you for any specific questions about what was found during the examination.  If the procedure report does not answer your questions, please call your gastroenterologist to clarify.  If you requested that your care partner not be given the details of your procedure findings, then the procedure report has been included in a sealed envelope for you to review at your convenience later.  YOU SHOULD EXPECT: Some feelings of bloating in the abdomen. Passage of more gas than usual.  Walking can help get rid of the air that was put into your GI tract during the procedure and reduce the bloating. If you had a lower endoscopy (such as a colonoscopy or flexible sigmoidoscopy) you may notice spotting of blood in your stool or on the toilet paper. If you underwent a bowel prep for your procedure, you may not have a normal bowel movement for a few days.  Please Note:  You might notice some irritation and congestion in your nose or some drainage.  This is from the oxygen used during your procedure.  There is no need for concern and it should clear up in a day or so.  SYMPTOMS TO REPORT IMMEDIATELY:  Following lower endoscopy (colonoscopy or flexible sigmoidoscopy):  Excessive amounts of blood in the stool  Significant tenderness or worsening of abdominal pains  Swelling of the abdomen that is new, acute  Fever of 100F or higher  Following upper endoscopy (EGD)  Vomiting of blood or coffee ground material  New chest pain or pain under the shoulder blades  Painful or persistently difficult swallowing  New shortness of breath  Fever of 100F or higher  Black, tarry-looking stools  For urgent or emergent issues, a gastroenterologist can be reached at any hour by calling [phone]. Do not use MyChart messaging for urgent concerns.    DIET:  We do recommend a  small meal at first, but then you may proceed to your regular diet.  Drink plenty of fluids but you should avoid alcoholic beverages for 24 hours.  ACTIVITY:  You should plan to take it easy for the rest of today and you should NOT DRIVE or use heavy machinery until tomorrow (because of the sedation medicines used during the test).    FOLLOW UP: Our staff will call the number listed on your records the next business day following your procedure.  We will call around 7:15- 8:00 am to check on you and address any questions or concerns that you may have regarding the information given to you following your procedure. If we do not reach you, we will leave a message.     If any biopsies were taken you will be contacted by phone or by letter within the next 1-3 weeks.  Please call us at [phone] if you have not heard about the biopsies in 3 weeks.    SIGNATURES/CONFIDENTIALITY: You and/or your care partner have signed paperwork which will be entered into your electronic medical record.  These signatures attest to the fact that that the information above on your After Visit Summary has been reviewed and is understood.  Full responsibility of the confidentiality of this discharge information lies with you and/or your care-partner.

## 2021-12-31 NOTE — Progress Notes (Signed)
Called to room to assist during endoscopic procedure.  Patient ID and intended procedure confirmed with present staff. Received instructions for my participation in the procedure from the performing physician.  

## 2021-12-31 NOTE — Progress Notes (Signed)
VSS, transported to PACU °

## 2021-12-31 NOTE — Progress Notes (Signed)
VS by CW  Pt's states no medical or surgical changes since previsit or office visit.  

## 2021-12-31 NOTE — Progress Notes (Signed)
History and Physical:  This patient presents for endoscopic testing for: Encounter Diagnoses  Name Primary?   Personal history of colonic polyps Yes   Gastroesophageal reflux disease, unspecified whether esophagitis present     66 year old woman here today for a surveillance colonoscopy as well as a diagnostic upper endoscopy for longstanding GERD with a family history of Barrett's esophagus (brother) At her last colonoscopy in August 2018, a 4 mm tubular adenoma was removed. 7 mm tubular adenoma removed in July 2013.  Reports decades of reflux diagnosed because of a burning right upper back pain for which she underwent cardiac work-up many years ago.  If she misses 1 daily dose of pantoprazole, she will have recurrence of that symptom.  Denies dysphagia, odynophagia, nausea or early satiety.  Patient is otherwise without complaints or active issues today.   Past Medical History: Past Medical History:  Diagnosis Date   Arthritis    Cluster headaches    GERD (gastroesophageal reflux disease)    Heart murmur    Hyperlipidemia    Hypertension    Hypothyroidism    Irritable bowel syndrome    Osteopenia    Ovarian cyst    PVC (premature ventricular contraction)    occasional PVCs     Past Surgical History: Past Surgical History:  Procedure Laterality Date   ANKLE SURGERY     left   CHOLECYSTECTOMY     COLONOSCOPY  09/09/2011   Procedure: COLONOSCOPY;  Surgeon: Lafayette Dragon, MD;  Location: [facility];  Service: Endoscopy;  Laterality: N/A;   DILATION AND CURETTAGE OF UTERUS     stromal puncture     both eyes   TOTAL SHOULDER REPLACEMENT Right    reverse,09/22/20  Family history Brother with reported Barrett's esophagus as noted above  Allergies: Allergies  Allergen Reactions   Nickel Dermatitis, Hives, Itching, Other (See Comments), Rash and Swelling    inflammation Other reaction(s): Dermatitis, Other (See Comments), Unknown inflammation    Mango Flavor Hives     Fresh mango, mango extract    Statins     Other reaction(s): significant side effects    Outpatient Meds: Current Outpatient Medications  Medication Sig Dispense Refill   aspirin EC 81 MG tablet      cholecalciferol (VITAMIN D) 1000 UNITS tablet Take 1,000 Units by mouth daily.       Coenzyme Q10 100 MG capsule 1 capsule (100 mg) 1 (one) time each day at the same time.     cyanocobalamin 500 MCG tablet Take 1,000 mcg by mouth daily.     Magnesium Gluconate 550 MG TABS Take 250 mg by mouth daily.     Multiple Vitamins-Minerals (ZINC PO) Take by mouth daily.     pantoprazole (PROTONIX) 40 MG tablet Take 1 tablet by mouth daily.     triamterene-hydrochlorothiazide (MAXZIDE-25) 37.5-25 MG tablet Take 1 tablet by mouth daily.     TURMERIC PO 1 capsule 1 (one) time each day at the same time.     gabapentin (NEURONTIN) 100 MG capsule as needed.     REPATHA 140 MG/ML SOSY SMARTSIG:1 Milliliter(s) SUB-Q Every 4 Weeks     valACYclovir (VALTREX) 500 MG tablet Take 1 tablet by mouth daily.     Current Facility-Administered Medications  Medication Dose Route Frequency Provider Last Rate Last Admin   0.9 %  sodium chloride infusion  500 mL Intravenous Continuous Danis, Estill Cotta III, MD       0.9 %  sodium chloride infusion  500  mL Intravenous Once Doran Stabler, MD          ___________________________________________________________________ Objective   Exam:  BP (!) 161/96   Pulse 74   Temp (!) 97.5 F (36.4 C)   Ht '5\' 4"'$  (1.626 m)   Wt 228 lb 12.8 oz (103.8 kg)   SpO2 98%   BMI 39.27 kg/m   CV: regular , S1/S2 Resp: clear to auscultation bilaterally, normal RR and effort noted GI: soft, no tenderness, with active bowel sounds.   Assessment: Encounter Diagnoses  Name Primary?   Personal history of colonic polyps Yes   Gastroesophageal reflux disease, unspecified whether esophagitis present      Plan: Colonoscopy EGD  The benefits and risks of the planned procedure  were described in detail with the patient or (when appropriate) their health care proxy.  Risks were outlined as including, but not limited to, bleeding, infection, perforation, adverse medication reaction leading to cardiac or pulmonary decompensation, pancreatitis (if ERCP).  The limitation of incomplete mucosal visualization was also discussed.  No guarantees or warranties were given.    The patient is appropriate for an endoscopic procedure in the ambulatory setting.   - Wilfrid Lund, MD

## 2021-12-31 NOTE — Op Note (Signed)
[facility] Patient Name: Maria Wade Procedure Date: 12/31/2021 12:29 PM MRN: [id number] Endoscopist: [place]. Loletha Carrow , MD, [id number] Age: 66 Referring MD:  Date of Birth: Oct 02, 1955 Gender: Female Account #: [id number] Procedure:                Colonoscopy Indications:              Surveillance: Personal history of adenomatous                            polyps on last colonoscopy 5 years ago                           81m TA Aug 2018                           754mTA July 2013 Medicines:                Monitored Anesthesia Care Procedure:                Pre-Anesthesia Assessment:                           - Prior to the procedure, a History and Physical                            was performed, and patient medications and                            allergies were reviewed. The patient's tolerance of                            previous anesthesia was also reviewed. The risks                            and benefits of the procedure and the sedation                            options and risks were discussed with the patient.                            All questions were answered, and informed consent                            was obtained. Prior Anticoagulants: The patient has                            taken no anticoagulant or antiplatelet agents. ASA                            Grade Assessment: III - A patient with severe                            systemic disease. After reviewing the risks and  benefits, the patient was deemed in satisfactory                            condition to undergo the procedure.                           After obtaining informed consent, the colonoscope                            was passed under direct vision. Throughout the                            procedure, the patient's blood pressure, pulse, and                            oxygen saturations were monitored continuously. The                            CF  [id number] #[id number] was introduced through the anus                            and advanced to the the cecum, identified by                            appendiceal orifice and ileocecal valve. The                            colonoscopy was somewhat difficult due to a                            redundant colon and significant looping. Successful                            completion of the procedure was aided by using                            manual pressure and straightening and shortening                            the scope to obtain bowel loop reduction. The                            patient tolerated the procedure well. The quality                            of the bowel preparation was good. The ileocecal                            valve, appendiceal orifice, and rectum were                            photographed. Scope In: 1:40:31 PM Scope Out: 2:02:08 PM Scope Withdrawal Time: 0 hours 16 minutes 22 seconds  Total Procedure Duration: 0 hours 21  minutes 37 seconds  Findings:                 The perianal and digital rectal examinations were                            normal.                           Four sessile polyps were found in the transverse                            colon and hepatic flexure. The polyps were                            diminutive in size. These polyps were removed with                            a cold snare. Resection and retrieval were complete.                           Two sessile polyps were found in the sigmoid colon                            and descending colon. The polyps were diminutive in                            size. These polyps were removed with a cold snare.                            Resection and retrieval were complete.                           Repeat examination of right colon under NBI                            performed.                           The exam was otherwise without abnormality on                            direct and  retroflexion views. Complications:            No immediate complications. Estimated Blood Loss:     Estimated blood loss was minimal. Impression:               - Four diminutive polyps in the transverse colon                            and at the hepatic flexure, removed with a cold                            snare. Resected and retrieved.                           -  Two diminutive polyps in the sigmoid colon and in                            the descending colon, removed with a cold snare.                            Resected and retrieved.                           - The examination was otherwise normal on direct                            and retroflexion views. Recommendation:           - Patient has a contact number available for                            emergencies. The signs and symptoms of potential                            delayed complications were discussed with the                            patient. Return to normal activities tomorrow.                            Written discharge instructions were provided to the                            patient.                           - Resume previous diet.                           - Continue present medications.                           - Await pathology results.                           - Repeat colonoscopy is recommended for                            surveillance. The colonoscopy date will be                            determined after pathology results from today's                            exam become available for review.                           - See the other procedure note for documentation of  additional recommendations. Avian Greenawalt L. Loletha Carrow, MD 12/31/2021 2:06:56 PM This report has been signed electronically.

## 2021-12-31 NOTE — Op Note (Signed)
[facility] Patient Name: Naoko Diperna Procedure Date: 12/31/2021 12:23 PM MRN: [id number] Endoscopist: [place]. Loletha Carrow , MD, [id number] Age: 66 Referring MD:  Date of Birth: 06/12/1955 Gender: Female Account #: [id number] Procedure:                Upper GI endoscopy Indications:              Heartburn, Screening for Barrett's esophagus in                            patient at risk for this condition (long-standing                            heartburn requiring PPI, brother with Hx Barrett's                            esophagus) Medicines:                Monitored Anesthesia Care Procedure:                Pre-Anesthesia Assessment:                           - Prior to the procedure, a History and Physical                            was performed, and patient medications and                            allergies were reviewed. The patient's tolerance of                            previous anesthesia was also reviewed. The risks                            and benefits of the procedure and the sedation                            options and risks were discussed with the patient.                            All questions were answered, and informed consent                            was obtained. Prior Anticoagulants: The patient has                            taken no anticoagulant or antiplatelet agents. ASA                            Grade Assessment: III - A patient with severe                            systemic disease. After reviewing the risks and  benefits, the patient was deemed in satisfactory                            condition to undergo the procedure.                           After obtaining informed consent, the endoscope was                            passed under direct vision. Throughout the                            procedure, the patient's blood pressure, pulse, and                            oxygen saturations were monitored  continuously. The                            GIF [id number] #[id number] was introduced through the                            mouth, and advanced to the second part of duodenum.                            The upper GI endoscopy was accomplished without                            difficulty. The patient tolerated the procedure                            well. Scope In: Scope Out: Findings:                 There is no endoscopic evidence of Barrett's                            esophagus, esophagitis, hiatal hernia, mucosal                            abnormalities or stricture in the entire esophagus.                           Multiple sessile fundic gland polyps were found in                            the gastric fundus and in the gastric body.                           The exam of the stomach was otherwise normal.                           The cardia and gastric fundus were normal on                            retroflexion.  The examined duodenum was normal. Complications:            No immediate complications. Estimated Blood Loss:     Estimated blood loss: none. Impression:               - Multiple fundic gland polyps.                           - Normal examined duodenum.                           - No specimens collected. Recommendation:           - Patient has a contact number available for                            emergencies. The signs and symptoms of potential                            delayed complications were discussed with the                            patient. Return to normal activities tomorrow.                            Written discharge instructions were provided to the                            patient.                           - Resume previous diet.                           - Continue present medications.                           - Follow an antireflux regimen indefinitely.                           - Return to my office PRN.                            - See the other procedure note for documentation of                            additional recommendations. Reshanda Lewey L. Loletha Carrow, MD 12/31/2021 2:16:19 PM This report has been signed electronically.

## 2022-01-01 ENCOUNTER — Telehealth: Payer: Self-pay

## 2022-01-01 NOTE — Telephone Encounter (Signed)
  Follow up Call-     12/31/2021   12:52 PM  Call back number  Post procedure Call Back phone  # [phone]  Permission to leave phone message Yes     Patient questions:  Do you have a fever, pain , or abdominal swelling? No. Pain Score  0 *  Have you tolerated food without any problems? Yes.    Have you been able to return to your normal activities? Yes.    Do you have any questions about your discharge instructions: Diet   No. Medications  No. Follow up visit  No.  Do you have questions or concerns about your Care? No.  Actions: * If pain score is 4 or above: No action needed, pain <4.

## 2022-01-03 ENCOUNTER — Encounter: Payer: Self-pay | Admitting: Gastroenterology

## 2022-11-25 ENCOUNTER — Other Ambulatory Visit (HOSPITAL_BASED_OUTPATIENT_CLINIC_OR_DEPARTMENT_OTHER): Payer: Self-pay | Admitting: Physician Assistant

## 2022-11-25 DIAGNOSIS — M9731XA Periprosthetic fracture around internal prosthetic right shoulder joint, initial encounter: Secondary | ICD-10-CM

## 2022-11-29 ENCOUNTER — Ambulatory Visit (HOSPITAL_COMMUNITY)
Admission: RE | Admit: 2022-11-29 | Discharge: 2022-11-29 | Disposition: A | Discharge status: Home / Self Care | Payer: No Typology Code available for payment source | Source: Ambulatory Visit | Attending: Physician Assistant | Admitting: Physician Assistant

## 2022-11-29 DIAGNOSIS — M9731XA Periprosthetic fracture around internal prosthetic right shoulder joint, initial encounter: Secondary | ICD-10-CM | POA: Diagnosis present
